# Patient Record
Sex: Female | Born: 1970 | Race: Black or African American | Hispanic: No | Marital: Single | Smoking: Never smoker
Health system: Southern US, Community
[De-identification: ages and names within clinical notes are randomized; demographics above are authoritative.]

## PROBLEM LIST (undated history)

## (undated) ENCOUNTER — Inpatient Hospital Stay (HOSPITAL_COMMUNITY): Payer: Self-pay

## (undated) DIAGNOSIS — E7800 Pure hypercholesterolemia, unspecified: Secondary | ICD-10-CM

## (undated) HISTORY — PX: UNILATERAL SALPINGECTOMY: SHX6160

## (undated) HISTORY — PX: KNEE SURGERY: SHX244

## (undated) HISTORY — PX: BREAST REDUCTION SURGERY: SHX8

## (undated) HISTORY — PX: OTHER SURGICAL HISTORY: SHX169

## (undated) HISTORY — PX: WISDOM TOOTH EXTRACTION: SHX21

---

## 2007-03-28 ENCOUNTER — Emergency Department (HOSPITAL_COMMUNITY): Admission: EM | Admit: 2007-03-28 | Discharge: 2007-03-28 | Payer: Self-pay | Admitting: Emergency Medicine

## 2010-03-31 ENCOUNTER — Encounter (INDEPENDENT_AMBULATORY_CARE_PROVIDER_SITE_OTHER): Payer: Self-pay | Admitting: Obstetrics and Gynecology

## 2010-03-31 ENCOUNTER — Ambulatory Visit (HOSPITAL_COMMUNITY): Admission: RE | Admit: 2010-03-31 | Discharge: 2010-04-02 | Payer: Self-pay | Admitting: Obstetrics and Gynecology

## 2010-08-02 LAB — CBC
HCT: 33.4 % — ABNORMAL LOW (ref 36.0–46.0)
Hemoglobin: 11.2 g/dL — ABNORMAL LOW (ref 12.0–15.0)
MCH: 32.8 pg (ref 26.0–34.0)
MCH: 33.1 pg (ref 26.0–34.0)
MCHC: 33.5 g/dL (ref 30.0–36.0)
MCHC: 33.6 g/dL (ref 30.0–36.0)
Platelets: 265 10*3/uL (ref 150–400)
RBC: 3.32 MIL/uL — ABNORMAL LOW (ref 3.87–5.11)
RDW: 13.7 % (ref 11.5–15.5)

## 2010-08-02 LAB — COMPREHENSIVE METABOLIC PANEL
ALT: 10 U/L (ref 0–35)
CO2: 26 mEq/L (ref 19–32)
Calcium: 8.9 mg/dL (ref 8.4–10.5)
Chloride: 106 mEq/L (ref 96–112)
GFR calc non Af Amer: >60 mL/min (ref >60)
Glucose, Bld: 104 mg/dL — ABNORMAL HIGH (ref 70–99)
Sodium: 136 mEq/L (ref 135–145)
Total Bilirubin: 0.6 mg/dL (ref 0.3–1.2)

## 2010-08-02 LAB — HCG, QUANTITATIVE, PREGNANCY: hCG, Beta Chain, Quant, S: 4857 m[IU]/mL — ABNORMAL HIGH (ref <5)

## 2014-08-17 ENCOUNTER — Other Ambulatory Visit (HOSPITAL_COMMUNITY): Payer: Self-pay | Admitting: Urology

## 2014-08-17 DIAGNOSIS — O09521 Supervision of elderly multigravida, first trimester: Secondary | ICD-10-CM

## 2014-08-17 DIAGNOSIS — Z3682 Encounter for antenatal screening for nuchal translucency: Secondary | ICD-10-CM

## 2014-09-03 ENCOUNTER — Encounter (HOSPITAL_COMMUNITY): Payer: Self-pay | Admitting: *Deleted

## 2014-09-03 ENCOUNTER — Inpatient Hospital Stay (HOSPITAL_COMMUNITY)
Admission: AD | Admit: 2014-09-03 | Discharge: 2014-09-03 | Disposition: A | Discharge status: Home / Self Care | Payer: Medicaid Other | Source: Ambulatory Visit | Attending: Obstetrics & Gynecology | Admitting: Obstetrics & Gynecology

## 2014-09-03 ENCOUNTER — Inpatient Hospital Stay (HOSPITAL_COMMUNITY): Payer: Medicaid Other

## 2014-09-03 ENCOUNTER — Other Ambulatory Visit (HOSPITAL_COMMUNITY): Payer: Self-pay | Admitting: Nurse Practitioner

## 2014-09-03 DIAGNOSIS — O209 Hemorrhage in early pregnancy, unspecified: Secondary | ICD-10-CM | POA: Insufficient documentation

## 2014-09-03 DIAGNOSIS — Z3A11 11 weeks gestation of pregnancy: Secondary | ICD-10-CM | POA: Diagnosis not present

## 2014-09-03 DIAGNOSIS — O4691 Antepartum hemorrhage, unspecified, first trimester: Secondary | ICD-10-CM

## 2014-09-03 DIAGNOSIS — O3680X0 Pregnancy with inconclusive fetal viability, not applicable or unspecified: Secondary | ICD-10-CM

## 2014-09-03 DIAGNOSIS — O09521 Supervision of elderly multigravida, first trimester: Secondary | ICD-10-CM | POA: Diagnosis not present

## 2014-09-03 DIAGNOSIS — Z3A12 12 weeks gestation of pregnancy: Secondary | ICD-10-CM | POA: Diagnosis not present

## 2014-09-03 LAB — CBC
HCT: 34.8 % — ABNORMAL LOW (ref 36.0–46.0)
Hemoglobin: 12.1 g/dL (ref 12.0–15.0)
MCH: 32.2 pg (ref 26.0–34.0)
MCHC: 34.8 g/dL (ref 30.0–36.0)
MCV: 92.6 fL (ref 78.0–100.0)
PLATELETS: 320 10*3/uL (ref 150–400)
RBC: 3.76 MIL/uL — ABNORMAL LOW (ref 3.87–5.11)
RDW: 12.9 % (ref 11.5–15.5)
WBC: 11.6 10*3/uL — ABNORMAL HIGH (ref 4.0–10.5)

## 2014-09-03 LAB — WET PREP, GENITAL
CLUE CELLS WET PREP: NONE SEEN
TRICH WET PREP: NONE SEEN
WBC, Wet Prep HPF POC: NONE SEEN
Yeast Wet Prep HPF POC: NONE SEEN

## 2014-09-03 LAB — HCG, QUANTITATIVE, PREGNANCY: hCG, Beta Chain, Quant, S: 5750 m[IU]/mL — ABNORMAL HIGH (ref <5)

## 2014-09-03 NOTE — Discharge Instructions (Signed)
Miscarriage °A miscarriage is the sudden loss of an unborn baby (fetus) before the 20th week of pregnancy. Most miscarriages happen in the first 3 months of pregnancy. Sometimes, it happens before a woman even knows she is pregnant. A miscarriage is also called a "spontaneous miscarriage" or "early pregnancy loss." Having a miscarriage can be an emotional experience. Talk with your caregiver about any questions you may have about miscarrying, the grieving process, and your future pregnancy plans. °CAUSES  °· Problems with the fetal chromosomes that make it impossible for the baby to develop normally. Problems with the baby's genes or chromosomes are most often the result of errors that occur, by chance, as the embryo divides and grows. The problems are not inherited from the parents. °· Infection of the cervix or uterus.   °· Hormone problems.   °· Problems with the cervix, such as having an incompetent cervix. This is when the tissue in the cervix is not strong enough to hold the pregnancy.   °· Problems with the uterus, such as an abnormally shaped uterus, uterine fibroids, or congenital abnormalities.   °· Certain medical conditions.   °· Smoking, drinking alcohol, or taking illegal drugs.   °· Trauma.   °Often, the cause of a miscarriage is unknown.  °SYMPTOMS  °· Vaginal bleeding or spotting, with or without cramps or pain. °· Pain or cramping in the abdomen or lower back. °· Passing fluid, tissue, or blood clots from the vagina. °DIAGNOSIS  °Your caregiver will perform a physical exam. You may also have an ultrasound to confirm the miscarriage. Blood or urine tests may also be ordered. °TREATMENT  °· Sometimes, treatment is not necessary if you naturally pass all the fetal tissue that was in the uterus. If some of the fetus or placenta remains in the body (incomplete miscarriage), tissue left behind may become infected and must be removed. Usually, a dilation and curettage (D and C) procedure is performed.  During a D and C procedure, the cervix is widened (dilated) and any remaining fetal or placental tissue is gently removed from the uterus. °· Antibiotic medicines are prescribed if there is an infection. Other medicines may be given to reduce the size of the uterus (contract) if there is a lot of bleeding. °· If you have Rh negative blood and your baby was Rh positive, you will need a Rh immunoglobulin shot. This shot will protect any future baby from having Rh blood problems in future pregnancies. °HOME CARE INSTRUCTIONS  °· Your caregiver may order bed rest or may allow you to continue light activity. Resume activity as directed by your caregiver. °· Have someone help with home and family responsibilities during this time.   °· Keep track of the number of sanitary pads you use each day and how soaked (saturated) they are. Write down this information.   °· Do not use tampons. Do not douche or have sexual intercourse until approved by your caregiver.   °· Only take over-the-counter or prescription medicines for pain or discomfort as directed by your caregiver.   °· Do not take aspirin. Aspirin can cause bleeding.   °· Keep all follow-up appointments with your caregiver.   °· If you or your partner have problems with grieving, talk to your caregiver or seek counseling to help cope with the pregnancy loss. Allow enough time to grieve before trying to get pregnant again.   °SEEK IMMEDIATE MEDICAL CARE IF:  °· You have severe cramps or pain in your back or abdomen. °· You have a fever. °· You pass large blood clots (walnut-sized   or larger) ortissue from your vagina. Save any tissue for your caregiver to inspect.   Your bleeding increases.   You have a thick, bad-smelling vaginal discharge.  You become lightheaded, weak, or you faint.   You have chills.  MAKE SURE YOU:  Understand these instructions.  Will watch your condition.  Will get help right away if you are not doing well or get  worse. Document Released: 11/01/2000 Document Revised: 09/02/2012 Document Reviewed: 06/27/2011 [facility] Patient Information 2015 [place], [place]. This information is not intended to replace advice given to you by your health care provider. Make sure you discuss any questions you have with your health care provider.  Ectopic Pregnancy An ectopic pregnancy is when the fertilized egg attaches (implants) outside the uterus. Most ectopic pregnancies occur in the fallopian tube. Rarely do ectopic pregnancies occur on the ovary, intestine, pelvis, or cervix. In an ectopic pregnancy, the fertilized egg does not have the ability to develop into a normal, healthy baby.  A ruptured ectopic pregnancy is one in which the fallopian tube gets torn or bursts and results in internal bleeding. Often there is intense abdominal pain, and sometimes, vaginal bleeding. Having an ectopic pregnancy can be life threatening. If left untreated, this dangerous condition can lead to a blood transfusion, abdominal surgery, or even death. CAUSES  Damage to the fallopian tubes is the suspected cause in most ectopic pregnancies.  RISK FACTORS Depending on your circumstances, the risk of having an ectopic pregnancy will vary. The level of risk can be divided into three categories. High Risk  You have gone through infertility treatment.  You have had a previous ectopic pregnancy.  You have had previous tubal surgery.  You have had previous surgery to have the fallopian tubes tied (tubal ligation).  You have tubal problems or diseases.  You have been exposed to DES. DES is a medicine that was used until 1971 and had effects on babies whose mothers took the medicine.  You become pregnant while using an intrauterine device (IUD) for birth control. Moderate Risk  You have a history of infertility.  You have a history of a sexually transmitted infection (STI).  You have a history of pelvic inflammatory disease  (PID).  You have scarring from endometriosis.  You have multiple sexual partners.  You smoke. Low Risk  You have had previous pelvic surgery.  You use vaginal douching.  You became sexually active before 42 years of age. SIGNS AND SYMPTOMS  An ectopic pregnancy should be suspected in anyone who has missed a period and has abdominal pain or bleeding.  You may experience normal pregnancy symptoms, such as:  Nausea.  Tiredness.  Breast tenderness.  Other symptoms may include:  Pain with intercourse.  Irregular vaginal bleeding or spotting.  Cramping or pain on one side or in the lower abdomen.  Fast heartbeat.  Passing out while having a bowel movement.  Symptoms of a ruptured ectopic pregnancy and internal bleeding may include:  Sudden, severe pain in the abdomen and pelvis.  Dizziness or fainting.  Pain in the shoulder area. DIAGNOSIS  Tests that may be performed include:  A pregnancy test.  An ultrasound test.  Testing the specific level of pregnancy hormone in the bloodstream.  Taking a sample of uterus tissue (dilation and curettage, D&C).  Surgery to perform a visual exam of the inside of the abdomen using a thin, lighted tube with a tiny camera on the end (laparoscope). TREATMENT  An injection of a medicine called  methotrexate may be given. This medicine causes the pregnancy tissue to be absorbed. It is given if:  The diagnosis is made early.  The fallopian tube has not ruptured.  You are considered to be a good candidate for the medicine. Usually, pregnancy hormone blood levels are checked after methotrexate treatment. This is to be sure the medicine is effective. It may take 4-6 weeks for the pregnancy to be absorbed (though most pregnancies will be absorbed by 3 weeks). Surgical treatment may be needed. A laparoscope may be used to remove the pregnancy tissue. If severe internal bleeding occurs, a cut (incision) may be made in the lower abdomen  (laparotomy), and the ectopic pregnancy is removed. This stops the bleeding. Part of the fallopian tube, or the whole tube, may be removed as well (salpingectomy). After surgery, pregnancy hormone tests may be done to be sure there is no pregnancy tissue left. You may receive a Rho (D) immune globulin shot if you are Rh negative and the father is Rh positive, or if you do not know the Rh type of the father. This is to prevent problems with any future pregnancy. SEEK IMMEDIATE MEDICAL CARE IF:  You have any symptoms of an ectopic pregnancy. This is a medical emergency. MAKE SURE YOU:  Understand these instructions.  Will watch your condition.  Will get help right away if you are not doing well or get worse. Document Released: 06/15/2004 Document Revised: 09/22/2013 Document Reviewed: 12/05/2012 [facility] Patient Information 2015 [place], [place]. This information is not intended to replace advice given to you by your health care provider. Make sure you discuss any questions you have with your health care provider.

## 2014-09-03 NOTE — MAU Provider Note (Signed)
History     CSN: [id number]  Arrival date and time: 09/03/14 [id number]   First Provider Initiated Contact with Patient 09/03/14 2053      No chief complaint on file.  HPI Comments: Crystal Cobb is a 44 y.o. [id number] at 1099w6d who presents today with vaginal bleeding. She states that she started bleeding around 1900 today, and about 10 mins ago she passed a large clot and possibly "something else" in the toilet. She was not sure if there was any tissue. She denies any abdominal pain at this time. [facility]   Vaginal Bleeding The patient's primary symptoms include vaginal bleeding. The patient's pertinent negatives include no pelvic pain. This is a new problem. The current episode started today. The problem has been gradually worsening. The patient is experiencing no pain. She is pregnant. Pertinent negatives include no abdominal pain, constipation, diarrhea, dysuria, fever, frequency, nausea, urgency or vomiting. She has been passing clots. She has been passing tissue.    History reviewed. No pertinent past medical history.  Past Surgical History  Procedure Laterality Date  . Knee surgery    . Tummy    . Lipo suction      as well as skin readjusted and tucked  . Breast reduction surgery    . Unilateral salpingectomy      right side  . Wisdom tooth extraction      History reviewed. No pertinent family history.  History  Substance Use Topics  . Smoking status: Never Smoker   . Smokeless tobacco: Not on file  . Alcohol Use: No    Allergies: No Known Allergies  Prescriptions prior to admission  Medication Sig Dispense Refill Last Dose  . Coconut Oil 1000 MG CAPS Take 1 capsule by mouth daily.   Past Week at Unknown time  . Iron-Vitamins (GERITOL PO) Take 1 tablet by mouth daily.   Past Week  . Multiple Minerals-Vitamins (CALCIUM-MAGNESIUM-ZINC-D3 PO) Take 1 tablet by mouth daily.   Past Week at Unknown time  . Multiple Vitamin (MULTIVITAMIN WITH  MINERALS) TABS tablet Take 1 tablet by mouth daily.   Past Week at Unknown time  . Probiotic Product (PROBIOTIC PO) Take 1 capsule by mouth daily.   Past Week at Unknown time  . RASPBERRY PO Take 2 capsules by mouth daily.   Past Week at Unknown time  . UNABLE TO FIND Take 2 capsules by mouth daily. Patient takes the herbal supplment Nettles.   Past Week at Unknown time  . vitamin C (ASCORBIC ACID) 500 MG tablet Take 500 mg by mouth daily.   Past Week at Unknown time    Review of Systems  Constitutional: Negative for fever.  Gastrointestinal: Negative for nausea, vomiting, abdominal pain, diarrhea and constipation.  Genitourinary: Positive for vaginal bleeding. Negative for dysuria, urgency, frequency and pelvic pain.   Physical Exam   Blood pressure 120/71, pulse 73, temperature 98.9 F (37.2 C), temperature source Oral, resp. rate 18, height 5\' 5"  (1.651 m), weight 194 lb 8 oz (88.225 kg).  Physical Exam  Nursing note and vitals reviewed. Constitutional: She is oriented to person, place, and time. She appears well-developed and well-nourished. No distress.  Cardiovascular: Normal rate.   Respiratory: Effort normal.  GI: Soft. There is no tenderness. There is no rebound.  Genitourinary:   External: no lesion, golf ball sized piece of tissue on the pad  Vagina: small amount of blood seen  Cervix: pink, smooth, no CMT  Uterus: slightly enlarged  Adnexa: NT   Neurological: She is alert and oriented to person, place, and time.  Skin: Skin is warm and dry.  Psychiatric: She has a normal mood and affect.   Results for orders placed or performed during the hospital encounter of 09/03/14 (from the past 24 hour(s))  CBC     Status: Abnormal   Collection Time: 09/03/14  8:39 PM  Result Value Ref Range   WBC 11.6 (H) 4.0 - 10.5 K/uL   RBC 3.76 (L) 3.87 - 5.11 MIL/uL   Hemoglobin 12.1 12.0 - 15.0 g/dL   HCT 57.8 (L) 46.9 - 62.9 %   MCV 92.6 78.0 - 100.0 fL   MCH 32.2 26.0 - 34.0 pg    MCHC 34.8 30.0 - 36.0 g/dL   RDW 52.8 41.3 - 24.4 %   Platelets 320 150 - 400 K/uL  hCG, quantitative, pregnancy     Status: Abnormal   Collection Time: 09/03/14  8:39 PM  Result Value Ref Range   hCG, Beta Chain, Quant, S 5750 (H) <5 mIU/mL  Wet prep, genital     Status: None   Collection Time: 09/03/14  9:00 PM  Result Value Ref Range   Yeast Wet Prep HPF POC NONE SEEN NONE SEEN   Trich, Wet Prep NONE SEEN NONE SEEN   Clue Cells Wet Prep HPF POC NONE SEEN NONE SEEN   WBC, Wet Prep HPF POC NONE SEEN NONE SEEN   US Ob Comp Less 14 Wks  09/03/2014   CLINICAL DATA:  Bleeding and cramps in first-trimester pregnancy. Possibly passed tissue.  EXAM: OBSTETRIC <14 WK [place] AND TRANSVAGINAL OB US  TECHNIQUE: Both transabdominal and transvaginal ultrasound examinations were performed for complete evaluation of the gestation as well as the maternal uterus, adnexal regions, and pelvic cul-de-sac. Transvaginal technique was performed to assess early pregnancy.  COMPARISON:  03/31/2010  FINDINGS: No intra or extrauterine gestational sac is identified. No adnexal mass and ovarian volume is symmetric. No free pelvic fluid. Bilateral ovarian follicles are noted.  These results were called by telephone at the time of interpretation on 09/03/2014 at 10:50 pm to Dr. Thressa Sheller , who verbally acknowledged these results.  IMPRESSION: Normal pelvic ultrasound with no visible gestation. Given patient's quantitative beta HCG this increases is concern for spontaneous abortion or ectopic pregnancy.   Electronically Signed   By: Marnee Spring M.D.   On: 09/03/2014 22:50   US Ob Transvaginal  09/03/2014   CLINICAL DATA:  Bleeding and cramps in first-trimester pregnancy. Possibly passed tissue.  EXAM: OBSTETRIC <14 WK [place] AND TRANSVAGINAL OB US  TECHNIQUE: Both transabdominal and transvaginal ultrasound examinations were performed for complete evaluation of the gestation as well as the maternal uterus, adnexal regions, and  pelvic cul-de-sac. Transvaginal technique was performed to assess early pregnancy.  COMPARISON:  03/31/2010  FINDINGS: No intra or extrauterine gestational sac is identified. No adnexal mass and ovarian volume is symmetric. No free pelvic fluid. Bilateral ovarian follicles are noted.  These results were called by telephone at the time of interpretation on 09/03/2014 at 10:50 pm to Dr. Thressa Sheller , who verbally acknowledged these results.  IMPRESSION: Normal pelvic ultrasound with no visible gestation. Given patient's quantitative beta HCG this increases is concern for spontaneous abortion or ectopic pregnancy.   Electronically Signed   By: Marnee Spring M.D.   On: 09/03/2014 22:50     MAU Course  Procedures  MDM   Assessment and Plan  1. Pregnancy of unknown anatomic location   2. Vaginal bleeding in pregnancy, first trimester    D/W the patient that this is most likely a miscarriage.  Return in 48 hours for repeat HCG Bleeding precautions Ectopic precautions  Return to MAU as needed  Follow-up Information    Follow up with [facility].   Why:  in 48 hours for repeat blood work    Contact information:   [address] [id number] mc [place] [address] [phone]      Tawnya Crook 09/03/2014, 8:54 PM

## 2014-09-03 NOTE — MAU Note (Signed)
PT  SAYS SHE STARTED   SPOTTING  BROWN  YESTERDAY  AND  THIS AM -  SO WENT  TO  HD-  LEFT  THERE AT 3 PM-  ALL TESTING  GOOD-  COULD  NOT  HEAR  FHT.    [facility]   THEN  AT  715PM-  FELT  BLEEDING-  PAD ON IN TRIAGE-  LARGE AMT- PAD SATURATED  AND ON CLOTHES.     TO RM  9-  STARTED FEELING  CRAMPING- LIGHT  - SAYS  MORE NOW BUT  NOT  SEVERE.    LAST SEX-   FEB

## 2014-09-04 LAB — GC/CHLAMYDIA PROBE AMP (~~LOC~~) NOT AT ARMC
Chlamydia: NEGATIVE
Neisseria Gonorrhea: NEGATIVE

## 2014-09-04 LAB — HIV ANTIBODY (ROUTINE TESTING W REFLEX): HIV SCREEN 4TH GENERATION: NONREACTIVE

## 2014-09-05 ENCOUNTER — Inpatient Hospital Stay (HOSPITAL_COMMUNITY)
Admission: AD | Admit: 2014-09-05 | Discharge: 2014-09-05 | Disposition: A | Discharge status: Home / Self Care | Payer: Medicaid Other | Source: Ambulatory Visit | Attending: Obstetrics & Gynecology | Admitting: Obstetrics & Gynecology

## 2014-09-05 DIAGNOSIS — O036 Delayed or excessive hemorrhage following complete or unspecified spontaneous abortion: Secondary | ICD-10-CM | POA: Insufficient documentation

## 2014-09-05 DIAGNOSIS — O039 Complete or unspecified spontaneous abortion without complication: Secondary | ICD-10-CM | POA: Diagnosis not present

## 2014-09-05 LAB — HCG, QUANTITATIVE, PREGNANCY: HCG, BETA CHAIN, QUANT, S: 1076 m[IU]/mL — AB (ref <5)

## 2014-09-05 NOTE — Discharge Instructions (Signed)
Miscarriage °A miscarriage is the loss of an unborn baby (fetus) before the 20th week of pregnancy. The cause is often unknown.  °HOME CARE °· You may need to stay in bed (bed rest), or you may be able to do light activity. Go about activity as told by your doctor. °· Have help at home. °· Write down how many pads you use each day. Write down how soaked they are. °· Do not use tampons. Do not wash out your vagina (douche) or have sex (intercourse) until your doctor approves. °· Only take medicine as told by your doctor. °· Do not take aspirin. °· Keep all doctor visits as told. °· If you or your partner have problems with grieving, talk to your doctor. You can also try counseling. Give yourself time to grieve before trying to get pregnant again. °GET HELP RIGHT AWAY IF: °· You have bad cramps or pain in your back or belly (abdomen). °· You have a fever. °· You pass large clumps of blood (clots) from your vagina that are walnut-sized or larger. Save the clumps for your doctor to see. °· You pass large amounts of tissue from your vagina. Save the tissue for your doctor to see. °· You have more bleeding. °· You have thick, bad-smelling fluid (discharge) coming from the vagina. °· You get lightheaded, weak, or you pass out (faint). °· You have chills. °MAKE SURE YOU: °· Understand these instructions. °· Will watch your condition. °· Will get help right away if you are not doing well or get worse. °Document Released: 07/31/2011 Document Reviewed: 07/31/2011 °ExitCare® Patient Information ©2015 Exit[employer], LLC. This information is not intended to replace advice given to you by your health care provider. Make sure you discuss any questions you have with your health care provider. ° °

## 2014-09-05 NOTE — MAU Note (Signed)
Crystal MaclachlanKaren Teague-Clark PA in Triage to talk with pt regarding lab results and d/c plan. Pt d/c home from Triage

## 2014-09-05 NOTE — MAU Provider Note (Signed)
Crystal Cobb 44 y.o. G5 presents to MAU for Quant HCG after having a miscarriage on 4/14.  She has continued to have light vaginal bleeding and no abdominal pain or other complaint.    HCG has dropped from 5750 to 1076 today Vitals stable  [facility]    Ectopic precautions Patient may return to MAU as needed or if her condition were to change or worsen

## 2014-09-05 NOTE — MAU Note (Signed)
Here for repeat BHCG. Denies any problems. No bleeding or pain

## 2014-09-07 ENCOUNTER — Ambulatory Visit (HOSPITAL_COMMUNITY): Admission: RE | Admit: 2014-09-07 | Payer: Medicaid Other | Source: Ambulatory Visit

## 2014-09-07 ENCOUNTER — Telehealth: Payer: Self-pay

## 2014-09-07 ENCOUNTER — Other Ambulatory Visit: Payer: Medicaid Other

## 2014-09-07 NOTE — Telephone Encounter (Signed)
Attempted to contact patient. No answer. Left message stating a lab appointment is not necessary today, however, we would like to schedule it for next Monday 09/14/14, please call clinic so that we can coordinate a time that works best with your schedule.

## 2014-09-07 NOTE — Telephone Encounter (Signed)
Patient called and scheduled lab for 09/14/14 at 1045.

## 2014-09-07 NOTE — Telephone Encounter (Signed)
-----   Message from Lesly DukesKelly H Leggett, MD sent at 09/06/2014  6:47 AM EDT ----- Patient doesn't need beta hcg until NEXT monday.  Unsure from note when she was told to come.  Had very large drop in hcg so clinical picture c/w miscarriage.

## 2014-09-10 ENCOUNTER — Other Ambulatory Visit (HOSPITAL_COMMUNITY): Payer: Self-pay

## 2014-09-10 ENCOUNTER — Ambulatory Visit (HOSPITAL_COMMUNITY): Payer: Self-pay | Attending: Physician Assistant

## 2014-09-10 ENCOUNTER — Ambulatory Visit (HOSPITAL_COMMUNITY): Admission: RE | Admit: 2014-09-10 | Payer: Self-pay | Source: Ambulatory Visit

## 2014-09-14 ENCOUNTER — Other Ambulatory Visit: Payer: Medicaid Other

## 2014-09-14 DIAGNOSIS — O209 Hemorrhage in early pregnancy, unspecified: Secondary | ICD-10-CM

## 2014-09-15 LAB — HCG, QUANTITATIVE, PREGNANCY: hCG, Beta Chain, Quant, S: 62.8 m[IU]/mL

## 2014-09-17 ENCOUNTER — Telehealth: Payer: Self-pay | Admitting: General Practice

## 2014-09-17 NOTE — Telephone Encounter (Signed)
-----   Message from Tereso NewcomerUgonna A Anyanwu, MD sent at 09/16/2014  5:23 PM EDT ----- Return next week for repeat HCG.  Please call to inform patient of results and recommendations.

## 2014-09-17 NOTE — Telephone Encounter (Signed)
Called patient and informed her of results and recommendations. Patient verbalized understanding and states she can come in next week 5/4 @ 10am. Patient had no questions

## 2014-09-23 ENCOUNTER — Other Ambulatory Visit: Payer: Medicaid Other

## 2014-09-23 DIAGNOSIS — O039 Complete or unspecified spontaneous abortion without complication: Secondary | ICD-10-CM

## 2014-09-24 ENCOUNTER — Telehealth: Payer: Self-pay

## 2014-09-24 LAB — HCG, QUANTITATIVE, PREGNANCY: hCG, Beta Chain, Quant, S: 4.3 m[IU]/mL

## 2014-09-24 NOTE — Telephone Encounter (Signed)
-----   Message from Tereso NewcomerUgonna A Anyanwu, MD sent at 09/24/2014  8:35 AM EDT ----- No longer pregnant, no further lab draws needed. Follow up with GYN provider/Health Department for contraception counseling and routine GYN care.  Please call to inform patient of results and recommendations.

## 2014-09-24 NOTE — Telephone Encounter (Signed)
Called patient and informed her of results. Patient does not wish to have birth control as she is trying to get pregnant. Advised patient wait for two cycles before she starts trying to conceive again.  Informed her should she have any problems or need routine GYN care she may call to schedule appointment here or with health department. Patient verbalized understanding and gratitude. No further questions or concerns.

## 2014-10-23 ENCOUNTER — Ambulatory Visit (HOSPITAL_COMMUNITY): Payer: Self-pay

## 2015-07-09 ENCOUNTER — Encounter (HOSPITAL_COMMUNITY): Payer: Self-pay | Admitting: *Deleted

## 2015-08-09 ENCOUNTER — Encounter (HOSPITAL_COMMUNITY): Payer: Self-pay | Admitting: *Deleted

## 2015-08-09 ENCOUNTER — Ambulatory Visit (HOSPITAL_COMMUNITY)
Admission: AD | Admit: 2015-08-09 | Discharge: 2015-08-10 | Disposition: A | Discharge status: Home / Self Care | Payer: Medicaid Other | Source: Ambulatory Visit | Attending: Obstetrics and Gynecology | Admitting: Obstetrics and Gynecology

## 2015-08-09 ENCOUNTER — Inpatient Hospital Stay (HOSPITAL_COMMUNITY): Payer: Medicaid Other

## 2015-08-09 DIAGNOSIS — IMO0002 Reserved for concepts with insufficient information to code with codable children: Secondary | ICD-10-CM

## 2015-08-09 DIAGNOSIS — N939 Abnormal uterine and vaginal bleeding, unspecified: Secondary | ICD-10-CM

## 2015-08-09 DIAGNOSIS — O021 Missed abortion: Secondary | ICD-10-CM | POA: Insufficient documentation

## 2015-08-09 DIAGNOSIS — Z3A10 10 weeks gestation of pregnancy: Secondary | ICD-10-CM | POA: Diagnosis not present

## 2015-08-09 DIAGNOSIS — O209 Hemorrhage in early pregnancy, unspecified: Secondary | ICD-10-CM | POA: Diagnosis present

## 2015-08-09 DIAGNOSIS — D649 Anemia, unspecified: Secondary | ICD-10-CM | POA: Diagnosis not present

## 2015-08-09 LAB — URINALYSIS, ROUTINE W REFLEX MICROSCOPIC
BILIRUBIN URINE: NEGATIVE
GLUCOSE, UA: NEGATIVE mg/dL
KETONES UR: 15 mg/dL — AB
Leukocytes, UA: NEGATIVE
Nitrite: NEGATIVE
PROTEIN: NEGATIVE mg/dL
Specific Gravity, Urine: >1.03 — ABNORMAL HIGH (ref 1.005–1.030)
pH: 5.5 (ref 5.0–8.0)

## 2015-08-09 LAB — WET PREP, GENITAL
SPERM: NONE SEEN
TRICH WET PREP: NONE SEEN
Yeast Wet Prep HPF POC: NONE SEEN

## 2015-08-09 LAB — CBC
HEMATOCRIT: 34.2 % — AB (ref 36.0–46.0)
Hemoglobin: 11.9 g/dL — ABNORMAL LOW (ref 12.0–15.0)
MCH: 32.9 pg (ref 26.0–34.0)
MCHC: 34.8 g/dL (ref 30.0–36.0)
MCV: 94.5 fL (ref 78.0–100.0)
Platelets: 313 10*3/uL (ref 150–400)
RBC: 3.62 MIL/uL — AB (ref 3.87–5.11)
RDW: 13.5 % (ref 11.5–15.5)
WBC: 11.6 10*3/uL — ABNORMAL HIGH (ref 4.0–10.5)

## 2015-08-09 LAB — URINE MICROSCOPIC-ADD ON

## 2015-08-09 NOTE — MAU Provider Note (Signed)
History     Crystal Cobb, Crystal Cobb is a 45yo, [id number], at 10.[redacted] wks ega presenting unannounced to MAU for vaginal bleeding.    She reports brownish discharge that started in the AM and has progressed to vaginal bleeding requiring the use of a sanitary pad.   Reports some mild cramping.   There are no active problems to display for this patient.   No chief complaint on file.  HPI  OB History    Gravida Para Term Preterm AB TAB SAB Ectopic Multiple Living   6 2   1   1  2       History reviewed. No pertinent past medical history.  Past Surgical History  Procedure Laterality Date  . Knee surgery    . Tummy      tummy tuck  . Lipo suction      as well as skin readjusted and tucked  . Breast reduction surgery    . Unilateral salpingectomy      right side  . Wisdom tooth extraction      No family history on file.  Social History  Substance Use Topics  . Smoking status: Never Smoker   . Smokeless tobacco: None  . Alcohol Use: No    Allergies: No Known Allergies  Prescriptions prior to admission  Medication Sig Dispense Refill Last Dose  . Multiple Vitamin (MULTIVITAMIN WITH MINERALS) TABS tablet Take 1 tablet by mouth daily.   08/08/2015 at Unknown time  . Omega-3 Fatty Acids (FISH OIL PO) Take 1 capsule by mouth every other day.   08/08/2015 at Unknown time    ROS Physical Exam   Blood pressure 116/67, pulse 63, temperature 99 F (37.2 C), temperature source Oral, resp. rate 16, height 5\' 6"  (1.676 m), weight 87.998 kg (194 lb), SpO2 100 %, unknown if currently breastfeeding.  Filed Vitals:   08/09/15 2103  BP: 116/67  Pulse: 63  Temp: 99 F (37.2 C)  TempSrc: Oral  Resp: 16  Height: 5\' 6"  (1.676 m)  Weight: 87.998 kg (194 lb)  SpO2: 100%    Results for orders placed or performed during the hospital encounter of 08/09/15 (from the past 24 hour(s))  Urinalysis, Routine w reflex microscopic (not at [facility])     Status: Abnormal   Collection Time: 08/09/15  9:02 PM   Result Value Ref Range   Color, Urine YELLOW YELLOW   APPearance HAZY (A) CLEAR   Specific Gravity, Urine >1.030 (H) 1.005 - 1.030   pH 5.5 5.0 - 8.0   Glucose, UA NEGATIVE NEGATIVE mg/dL   Hgb urine dipstick LARGE (A) NEGATIVE   Bilirubin Urine NEGATIVE NEGATIVE   Ketones, ur 15 (A) NEGATIVE mg/dL   Protein, ur NEGATIVE NEGATIVE mg/dL   Nitrite NEGATIVE NEGATIVE   Leukocytes, UA NEGATIVE NEGATIVE  Urine microscopic-add on     Status: Abnormal   Collection Time: 08/09/15  9:02 PM  Result Value Ref Range   Squamous Epithelial / LPF 6-30 (A) NONE SEEN   WBC, UA 0-5 0 - 5 WBC/hpf   RBC / HPF 6-30 0 - 5 RBC/hpf   Bacteria, UA MANY (A) NONE SEEN   Urine-Other MUCOUS PRESENT   CBC     Status: Abnormal   Collection Time: 08/09/15 11:10 PM  Result Value Ref Range   WBC 11.6 (H) 4.0 - 10.5 K/uL   RBC 3.62 (L) 3.87 - 5.11 MIL/uL   Hemoglobin 11.9 (L) 12.0 - 15.0 g/dL   HCT 16.134.2 (L) 09.636.0 - 04.546.0 %  MCV 94.5 78.0 - 100.0 fL   MCH 32.9 26.0 - 34.0 pg   MCHC 34.8 30.0 - 36.0 g/dL   RDW 14.7 82.9 - 56.2 %   Platelets 313 150 - 400 K/uL     Physical Exam  Constitutional: She is oriented to person, place, and time. She appears well-developed and well-nourished.  HENT:  Head: Normocephalic and atraumatic.  Eyes: Pupils are equal, round, and reactive to light.  Neck: Normal range of motion.  Cardiovascular: Normal rate.   Respiratory: Effort normal.  GI: Soft.  Genitourinary: Cervix exhibits no friability. There is bleeding in the vagina.  Musculoskeletal: Normal range of motion.  Neurological: She is alert and oriented to person, place, and time. She has normal reflexes.  Skin: Skin is warm and dry.  Psychiatric: She has a normal mood and affect. Her behavior is normal. Judgment and thought content normal.    ED Course  Assessment: First trimester vaginal bleeding  Plan: Wet prep GC./CT culture CBC HCG [place]    Alphonzo Severance CNM, MSN 08/09/2015 10:32 PM

## 2015-08-09 NOTE — MAU Note (Signed)
Pt reports brownish discharge that started this am and is now having bleeding, states she has just had to put on a pad.

## 2015-08-10 ENCOUNTER — Encounter (HOSPITAL_COMMUNITY)
Admission: AD | Disposition: A | Discharge status: Home / Self Care | Payer: Self-pay | Source: Ambulatory Visit | Attending: Obstetrics and Gynecology

## 2015-08-10 ENCOUNTER — Inpatient Hospital Stay (HOSPITAL_COMMUNITY): Payer: Medicaid Other | Admitting: Anesthesiology

## 2015-08-10 ENCOUNTER — Encounter (HOSPITAL_COMMUNITY): Payer: Self-pay | Admitting: Anesthesiology

## 2015-08-10 DIAGNOSIS — Z3A10 10 weeks gestation of pregnancy: Secondary | ICD-10-CM | POA: Diagnosis not present

## 2015-08-10 DIAGNOSIS — D649 Anemia, unspecified: Secondary | ICD-10-CM | POA: Diagnosis not present

## 2015-08-10 DIAGNOSIS — IMO0002 Reserved for concepts with insufficient information to code with codable children: Secondary | ICD-10-CM

## 2015-08-10 DIAGNOSIS — O021 Missed abortion: Secondary | ICD-10-CM | POA: Diagnosis not present

## 2015-08-10 HISTORY — PX: DILATION AND EVACUATION: SHX1459

## 2015-08-10 LAB — HCG, QUANTITATIVE, PREGNANCY: HCG, BETA CHAIN, QUANT, S: 11975 m[IU]/mL — AB (ref <5)

## 2015-08-10 LAB — TYPE AND SCREEN
ABO/RH(D): O POS
ANTIBODY SCREEN: NEGATIVE

## 2015-08-10 LAB — MRSA PCR SCREENING: MRSA by PCR: NEGATIVE

## 2015-08-10 SURGERY — DILATION AND EVACUATION, UTERUS
Anesthesia: Monitor Anesthesia Care | Site: Vagina

## 2015-08-10 MED ORDER — ONDANSETRON HCL 4 MG/2ML IJ SOLN
INTRAMUSCULAR | Status: DC | PRN
  Administered 2015-08-10: 4 mg via INTRAVENOUS

## 2015-08-10 MED ORDER — LIDOCAINE HCL (CARDIAC) 20 MG/ML IV SOLN
INTRAVENOUS | Status: DC | PRN
  Administered 2015-08-10: 50 mg via INTRAVENOUS

## 2015-08-10 MED ORDER — PROPOFOL 500 MG/50ML IV EMUL
INTRAVENOUS | Status: DC | PRN
  Administered 2015-08-10 (×3): 50 mg via INTRAVENOUS

## 2015-08-10 MED ORDER — HYDROCODONE-ACETAMINOPHEN 7.5-325 MG PO TABS: 1.0000 | ORAL_TABLET | Freq: Once | ORAL | Status: DC | PRN

## 2015-08-10 MED ORDER — BUPIVACAINE-EPINEPHRINE (PF) 0.5% -1:200000 IJ SOLN
INTRAMUSCULAR | Status: AC
  Filled 2015-08-10: qty 30

## 2015-08-10 MED ORDER — IBUPROFEN 800 MG PO TABS: 800.0000 mg | ORAL_TABLET | Freq: Three times a day (TID) | ORAL | Status: AC | PRN

## 2015-08-10 MED ORDER — MIDAZOLAM HCL 2 MG/2ML IJ SOLN
INTRAMUSCULAR | Status: DC | PRN
  Administered 2015-08-10: 2 mg via INTRAVENOUS

## 2015-08-10 MED ORDER — KETOROLAC TROMETHAMINE 30 MG/ML IJ SOLN: 30.0000 mg | Freq: Once | INTRAMUSCULAR | Status: DC

## 2015-08-10 MED ORDER — FENTANYL CITRATE (PF) 100 MCG/2ML IJ SOLN
INTRAMUSCULAR | Status: AC
  Filled 2015-08-10: qty 2

## 2015-08-10 MED ORDER — LACTATED RINGERS IV SOLN
INTRAVENOUS | Status: DC | PRN
  Administered 2015-08-10: 12:00:00 via INTRAVENOUS

## 2015-08-10 MED ORDER — FENTANYL CITRATE (PF) 100 MCG/2ML IJ SOLN
INTRAMUSCULAR | Status: DC | PRN
  Administered 2015-08-10 (×2): 50 ug via INTRAVENOUS

## 2015-08-10 MED ORDER — KETOROLAC TROMETHAMINE 30 MG/ML IJ SOLN
INTRAMUSCULAR | Status: DC | PRN
  Administered 2015-08-10: 30 mg via INTRAVENOUS

## 2015-08-10 MED ORDER — BUPIVACAINE-EPINEPHRINE 0.5% -1:200000 IJ SOLN
INTRAMUSCULAR | Status: DC | PRN
  Administered 2015-08-10: 10 mL

## 2015-08-10 MED ORDER — PROMETHAZINE HCL 25 MG/ML IJ SOLN: 6.2500 mg | INTRAMUSCULAR | Status: DC | PRN

## 2015-08-10 MED ORDER — MIDAZOLAM HCL 2 MG/2ML IJ SOLN
INTRAMUSCULAR | Status: AC
  Filled 2015-08-10: qty 2

## 2015-08-10 MED ORDER — FENTANYL CITRATE (PF) 100 MCG/2ML IJ SOLN: 25.0000 ug | INTRAMUSCULAR | Status: DC | PRN

## 2015-08-10 SURGICAL SUPPLY — 19 items
CATH ROBINSON RED A/P 16FR (CATHETERS) ×4 IMPLANT
CLOTH BEACON ORANGE TIMEOUT ST (SAFETY) ×4 IMPLANT
CONTAINER PREFILL 10% NBF 60ML (FORM) IMPLANT
DECANTER SPIKE VIAL GLASS SM (MISCELLANEOUS) ×4 IMPLANT
GLOVE BIOGEL PI IND STRL 7.0 (GLOVE) ×2 IMPLANT
GLOVE BIOGEL PI IND STRL 8.5 (GLOVE) ×2 IMPLANT
GLOVE BIOGEL PI INDICATOR 7.0 (GLOVE) ×2
GLOVE BIOGEL PI INDICATOR 8.5 (GLOVE) ×2
GLOVE ECLIPSE 8.0 STRL XLNG CF (GLOVE) ×8 IMPLANT
GOWN STRL REUS W/TWL LRG LVL3 (GOWN DISPOSABLE) ×8 IMPLANT
KIT BERKELEY 1ST TRIMESTER 3/8 (MISCELLANEOUS) ×4 IMPLANT
PACK VAGINAL MINOR WOMEN LF (CUSTOM PROCEDURE TRAY) ×4 IMPLANT
PAD OB MATERNITY 4.3X12.25 (PERSONAL CARE ITEMS) ×4 IMPLANT
PAD PREP 24X48 CUFFED NSTRL (MISCELLANEOUS) ×4 IMPLANT
SET BERKELEY SUCTION TUBING (SUCTIONS) ×4 IMPLANT
SLEEVE SCD COMPRESS KNEE MED (MISCELLANEOUS) ×4 IMPLANT
TOWEL OR 17X24 6PK STRL BLUE (TOWEL DISPOSABLE) ×8 IMPLANT
VACURETTE 8 RIGID CVD (CANNULA) ×4 IMPLANT
WATER STERILE IRR 1000ML POUR (IV SOLUTION) ×4 IMPLANT

## 2015-08-10 NOTE — Op Note (Signed)
OPERATIVE NOTE  Crystal Cobb  DOB:    December 26, 1970  MRN:    [id number]  CSN:    [id number]  Date of Surgery:  08/10/2015  Preoperative Diagnosis:  Missed Abortion at 4332w3d Gestation Anemia  Postoperative Diagnosis:  Missed Abortion at 5732w3d Gestation Anemia  Procedure:  FIRST TRIMESTER SUCTION DILATATION AND CURETTAGE  Surgeon:  Leonard SchwartzArthur Vernon Graig Hessling, M.D.  Assistant:  None  Anesthetic:  Monitor Anesthesia Care  Disposition:  The patient is a 45 y.o. year old female who presents at 4732w3d gestation with a nonviable gestation based on ultrasound and/or quantitative hCG values. She understands the indications for her surgical procedure as well as the alternative treatment options. She accepts the risk of, but not limited to, anesthetic complications, bleeding, infections, and possible damage to the surrounding organs.  Findings:  The patient's blood type is O+. A moderate amount of products of conception were removed from within a 8-10 weeks size uterus. No adnexal masses were appreciated.  Procedure:  The patient was taken to the operating room where a Monitor Anesthesia Care anesthetic was given. The patient's abdomen, perineum, and vagina were prepped with Betadine. The bladder was drained of urine. The patient was sterilely draped. Examination under anesthesia was performed. A paracervical block was placed using 10 cc of half percent Marcaine with epinephrine. The uterus sounded to 10 centimeters. The cervix was gently dilated. The uterine cavity was evacuated using a size 8 suction curet. The cavity was then cleaned using a medium sharp curet. The cavity was felt to be clean at the end of our procedure. The estimated blood loss was 50 cc. The uterus was reexamined and was noted to be firm. Hemostasis was adequate. Sponge and needle counts were correct. The patient tolerated her but her procedure well. She was awakened from her anesthetic without difficulty. She was  transported to the recovery room in stable condition. The products of conception were sent to pathology.  Followup instructions:  The patient return to see Dr. Stefano GaulStringer in 2 weeks. She was given prescriptions for pain medications. She was given instructions for patients who've undergone the surgical procedure. She will call for questions or concerns.  Discharge Medications:  Motrin 800 mg every 8 hours as needed for pain.  Leonard SchwartzArthur Vernon Deiontae Rabel, M.D.

## 2015-08-10 NOTE — Discharge Instructions (Signed)
Dilation and Curettage or Vacuum Curettage  Dilation and curettage (D&C) and vacuum curettage are minor procedures. A D&C involves stretching (dilation) the cervix and scraping (curettage) the inside lining of the womb (uterus). During a D&C, tissue is gently scraped from the inside lining of the uterus. During a vacuum curettage, the lining and tissue in the uterus are removed with the use of gentle suction.  Curettage may be performed to either diagnose or treat a problem. As a diagnostic procedure, curettage is performed to examine tissues from the uterus. A diagnostic curettage may be performed for the following symptoms:   Irregular bleeding in the uterus.   Bleeding with the development of clots.   Spotting between menstrual periods.   Prolonged menstrual periods.   Bleeding after menopause.   No menstrual period (amenorrhea).   A change in size and shape of the uterus.  As a treatment procedure, curettage may be performed for the following reasons:   Removal of an IUD (intrauterine device).   Removal of retained placenta after giving birth. Retained placenta can cause an infection or bleeding severe enough to require transfusions.   Abortion.   Miscarriage.   Removal of polyps inside the uterus.   Removal of uncommon types of noncancerous lumps (fibroids).  LET [facility] CARE PROVIDER KNOW ABOUT:   Any allergies you have.   All medicines you are taking, including vitamins, herbs, eye drops, creams, and over-the-counter medicines.   Previous problems you or members of your family have had with the use of anesthetics.   Any blood disorders you have.   Previous surgeries you have had.   Medical conditions you have. RISKS AND COMPLICATIONS  Generally, this is a safe procedure. However, as with any procedure, complications can occur. Possible complications include:  Excessive bleeding.   Infection of the uterus.   Damage to the cervix.    Development of scar tissue (adhesions) inside the uterus, later causing abnormal amounts of menstrual bleeding.   Complications from the general anesthetic, if a general anesthetic is used.   Putting a hole (perforation) in the uterus. This is rare.  BEFORE THE PROCEDURE   Eat and drink before the procedure only as directed by your health care provider.   Arrange for someone to take you home.  PROCEDURE  This procedure usually takes about 15-30 minutes.  You will be given one of the following:  A medicine that numbs the area in and around the cervix (local anesthetic).   A medicine to make you sleep through the procedure (general anesthetic).  You will lie on your back with your legs in stirrups.   A warm metal or plastic instrument (speculum) will be placed in your vagina to keep it open and to allow the health care provider to see the cervix.  There are two ways in which your cervix can be softened and dilated. These include:   Taking a medicine.   Having thin rods (laminaria) inserted into your cervix.   A curved tool (curette) will be used to scrape cells from the inside lining of the uterus. In some cases, gentle suction is applied with the curette. The curette will then be removed.  AFTER THE PROCEDURE   You will rest in the recovery area until you are stable and are ready to go home.   You may feel sick to your stomach (nauseous) or throw up (vomit) if you were given a general anesthetic.   You may have a sore throat if a  tube was placed in your throat during general anesthesia.   You may have light cramping and bleeding. This may last for 2 days to 2 weeks after the procedure.   Your uterus needs to make a new lining after the procedure. This may make your next period late.   This information is not intended to replace advice given to you by your health care provider. Make sure you discuss any questions you have with your health care provider.    Document Released: 05/08/2005 Document Revised: 01/08/2013 Document Reviewed: 12/05/2012 Elsevier Interactive Patient Education 2016 [employer]    Post Anesthesia Home Care Instructions  Activity: Get plenty of rest for the remainder of the day. A responsible adult should stay with you for 24 hours following the procedure.  For the next 24 hours, DO NOT: -Drive a car -Advertising copywriterperate machinery -Drink alcoholic beverages -Take any medication unless instructed by your physician -Make any legal decisions or sign important papers.  Meals: Start with liquid foods such as gelatin or soup. Progress to regular foods as tolerated. Avoid greasy, spicy, heavy foods. If nausea and/or vomiting occur, drink only clear liquids until the nausea and/or vomiting subsides. Call your physician if vomiting continues.  Special Instructions/Symptoms: Your throat may feel dry or sore from the anesthesia or the breathing tube placed in your throat during surgery. If this causes discomfort, gargle with warm salt water. The discomfort should disappear within 24 hours.  If you had a scopolamine patch placed behind your ear for the management of post- operative nausea and/or vomiting:  1. The medication in the patch is effective for 72 hours, after which it should be removed.  Wrap patch in a tissue and discard in the trash. Wash hands thoroughly with soap and water. 2. You may remove the patch earlier than 72 hours if you experience unpleasant side effects which may include dry mouth, dizziness or visual disturbances. 3. Avoid touching the patch. Wash your hands with soap and water after contact with the patch.   DISCHARGE INSTRUCTIONS: D&C / D&E The following instructions have been prepared to help you care for yourself upon your return home.   Personal hygiene:  Use sanitary pads for vaginal drainage, not tampons.  Shower the day after your procedure.  NO tub baths, pools or Jacuzzis for 2-3 weeks.  Wipe  front to back after using the bathroom.  Activity and limitations:  Do NOT drive or operate any equipment for 24 hours. The effects of anesthesia are still present and drowsiness may result.  Do NOT rest in bed all day.  Walking is encouraged.  Walk up and down stairs slowly.  You may resume your normal activity in one to two days or as indicated by your physician.  Sexual activity: NO intercourse for at least 2 weeks after the procedure, or as indicated by your physician.  Diet: Eat a light meal as desired this evening. You may resume your usual diet tomorrow.  Return to work: You may resume your work activities in one to two days or as indicated by your doctor.  What to expect after your surgery: Expect to have vaginal bleeding/discharge for 2-3 days and spotting for up to 10 days. It is not unusual to have soreness for up to 1-2 weeks. You may have a slight burning sensation when you urinate for the first day. Mild cramps may continue for a couple of days. You may have a regular period in 2-6 weeks.  Call your doctor for any  of the following:  Excessive vaginal bleeding, saturating and changing one pad every hour.  Inability to urinate 6 hours after discharge from hospital.  Pain not relieved by pain medication.  Fever of 100.4 F or greater.  Unusual vaginal discharge or odor.   Call for an appointment:    Patients signature: ______________________  Nurses signature ________________________  Support person's signature_______________________

## 2015-08-10 NOTE — H&P (Signed)
Crystal Cobb is an 45 y.o. female, presenting with c/o brownish discharge that started in am on 08/09/15 that progressively increased throughout the day resulting in use of a sanitary pad. Reports occasional mild cramping.   Patient Active Problem List   Diagnosis Date Noted  . Fetal demise 08/10/2015    Bleeding: vaginal bleeding  OB History: G5, [id number]  MEDICAL/FAMILY/SOCIAL HX: No LMP recorded. Patient is pregnant.    History reviewed. No pertinent past medical history.  Past Surgical History  Procedure Laterality Date  . Knee surgery    . Tummy      tummy tuck  . Lipo suction      as well as skin readjusted and tucked  . Breast reduction surgery    . Unilateral salpingectomy      right side  . Wisdom tooth extraction      No family history on file.  Social History:  reports that she has never smoked. She does not have any smokeless tobacco history on file. She reports that she does not drink alcohol or use illicit drugs.  ALLERGIES/MEDS:  Allergies: No Known Allergies  Prescriptions prior to admission  Medication Sig Dispense Refill Last Dose  . Multiple Vitamin (MULTIVITAMIN WITH MINERALS) TABS tablet Take 1 tablet by mouth daily.   08/08/2015 at Unknown time  . Omega-3 Fatty Acids (FISH OIL PO) Take 1 capsule by mouth every other day.   08/08/2015 at Unknown time     Review of Systems  Constitutional: Negative.   HENT: Negative.   Eyes: Negative.   Respiratory: Negative.   Cardiovascular: Negative.   Gastrointestinal: Negative.   Genitourinary:       Vaginal bleeding and occasional mild cramping  Musculoskeletal: Negative.   Skin: Negative.   Neurological: Negative.   Endo/Heme/Allergies: Negative.   Psychiatric/Behavioral: Negative.     Blood pressure 117/61, pulse 57, temperature 98.4 F (36.9 C), temperature source Oral, resp. rate 16, height 5\' 6"  (1.676 m), weight 87.998 kg (194 lb), SpO2 100 %, unknown if currently breastfeeding.   Physical  Exam  Constitutional: She is oriented to person, place, and time. She appears well-developed and well-nourished.  HENT:  Head: Normocephalic and atraumatic.  Eyes: Pupils are equal, round, and reactive to light.  Neck: Normal range of motion.  Cardiovascular: Normal rate.   Respiratory: Effort normal.  GI: Soft.  Genitourinary: There is tenderness and bleeding in the vagina. Vaginal discharge found.  Musculoskeletal: Normal range of motion.  Neurological: She is alert and oriented to person, place, and time. She has normal reflexes.  Skin: Skin is warm and dry.  Psychiatric: She has a normal mood and affect. Her behavior is normal. Judgment and thought content normal.    Results for orders placed or performed during the hospital encounter of 08/09/15 (from the past 24 hour(s))  Urinalysis, Routine w reflex microscopic ([facility])     Status: Abnormal   Collection Time: 08/09/15  9:02 PM  Result Value Ref Range   Color, Urine YELLOW YELLOW   APPearance HAZY (A) CLEAR   Specific Gravity, Urine >1.030 (H) 1.005 - 1.030   pH 5.5 5.0 - 8.0   Glucose, UA NEGATIVE NEGATIVE mg/dL   Hgb urine dipstick LARGE (A) NEGATIVE   Bilirubin Urine NEGATIVE NEGATIVE   Ketones, ur 15 (A) NEGATIVE mg/dL   Protein, ur NEGATIVE NEGATIVE mg/dL   Nitrite NEGATIVE NEGATIVE   Leukocytes, UA NEGATIVE NEGATIVE  Urine microscopic-add on     Status: Abnormal  Collection Time: 08/09/15  9:02 PM  Result Value Ref Range   Squamous Epithelial / LPF 6-30 (A) NONE SEEN   WBC, UA 0-5 0 - 5 WBC/hpf   RBC / HPF 6-30 0 - 5 RBC/hpf   Bacteria, UA MANY (A) NONE SEEN   Urine-Other MUCOUS PRESENT   Wet prep, genital     Status: Abnormal   Collection Time: 08/09/15 11:00 PM  Result Value Ref Range   Yeast Wet Prep HPF POC NONE SEEN NONE SEEN   Trich, Wet Prep NONE SEEN NONE SEEN   Clue Cells Wet Prep HPF POC PRESENT (A) NONE SEEN   WBC, Wet Prep HPF POC FEW (A) NONE SEEN   Sperm NONE SEEN   CBC     Status:  Abnormal   Collection Time: 08/09/15 11:10 PM  Result Value Ref Range   WBC 11.6 (H) 4.0 - 10.5 K/uL   RBC 3.62 (L) 3.87 - 5.11 MIL/uL   Hemoglobin 11.9 (L) 12.0 - 15.0 g/dL   HCT 66.0 (L) 63.0 - 16.0 %   MCV 94.5 78.0 - 100.0 fL   MCH 32.9 26.0 - 34.0 pg   MCHC 34.8 30.0 - 36.0 g/dL   RDW 10.9 32.3 - 55.7 %   Platelets 313 150 - 400 K/uL  hCG, quantitative, pregnancy     Status: Abnormal   Collection Time: 08/09/15 11:14 PM  Result Value Ref Range   hCG, Beta Chain, Quant, S 11975 (H) <5 mIU/mL  Type and screen [facility]     Status: None   Collection Time: 08/10/15 12:43 AM  Result Value Ref Range   ABO/RH(D) O POS    Antibody Screen NEG    Sample Expiration 08/13/2015     US Ob Comp Less 14 Wks  08/10/2015  CLINICAL DATA:  Vaginal bleeding and cramping. Estimated gestational age by LMP is 10 weeks 2 days. Quantitative beta HCG is in progress. EXAM: OBSTETRIC <14 WK [place] AND TRANSVAGINAL OB US TECHNIQUE: Both transabdominal and transvaginal ultrasound examinations were performed for complete evaluation of the gestation as well as the maternal uterus, adnexal regions, and pelvic cul-de-sac. Transvaginal technique was performed to assess early pregnancy. COMPARISON:  None. FINDINGS: Intrauterine gestational sac: Intrauterine gestational sac is present. Gestational sac is somewhat flat. Yolk sac:  Yolk sac is present. Embryo:  Fetal pole is present. Cardiac Activity: No fetal cardiac activity is identified. CRL:  14.8  mm   7 w   6 d                  [place] EDC: 03/21/2016 Subchorionic hemorrhage:  None visualized. Maternal uterus/adnexae: Uterus is anteverted. No myometrial mass lesions are demonstrated. Cervix is unremarkable. Both ovaries are visualized. No abnormal adnexal masses are seen. No free fluid in the pelvis. IMPRESSION: Single intrauterine pregnancy is identified. Estimated gestational age based on crown-rump length is 7 weeks 6 days. No fetal cardiac activity is  observed. Based on crown-rump length of greater than 7 mm and no heartbeat, this meets criteria for failed pregnancy. This follows SRU consensus guidelines: Diagnostic Criteria for Nonviable Pregnancy Early in the First Trimester. [facility] Mis J Med [phone]. Electronically Signed   By: Burman Nieves M.D.   On: 08/10/2015 00:06   US Ob Transvaginal  08/10/2015  CLINICAL DATA:  Vaginal bleeding and cramping. Estimated gestational age by LMP is 10 weeks 2 days. Quantitative beta HCG is in progress. EXAM: OBSTETRIC <14 WK [place] AND TRANSVAGINAL OB US  TECHNIQUE: Both transabdominal and transvaginal ultrasound examinations were performed for complete evaluation of the gestation as well as the maternal uterus, adnexal regions, and pelvic cul-de-sac. Transvaginal technique was performed to assess early pregnancy. COMPARISON:  None. FINDINGS: Intrauterine gestational sac: Intrauterine gestational sac is present. Gestational sac is somewhat flat. Yolk sac:  Yolk sac is present. Embryo:  Fetal pole is present. Cardiac Activity: No fetal cardiac activity is identified. CRL:  14.8  mm   7 w   6 d                  [place] EDC: 03/21/2016 Subchorionic hemorrhage:  None visualized. Maternal uterus/adnexae: Uterus is anteverted. No myometrial mass lesions are demonstrated. Cervix is unremarkable. Both ovaries are visualized. No abnormal adnexal masses are seen. No free fluid in the pelvis. IMPRESSION: Single intrauterine pregnancy is identified. Estimated gestational age based on crown-rump length is 7 weeks 6 days. No fetal cardiac activity is observed. Based on crown-rump length of greater than 7 mm and no heartbeat, this meets criteria for failed pregnancy. This follows SRU consensus guidelines: Diagnostic Criteria for Nonviable Pregnancy Early in the First Trimester. [facility] Mis J Med [phone]. Electronically Signed   By: Burman Nieves M.D.   On: 08/10/2015 00:06    ASSESSMENT: Failed Pregnancy IUP at [redacted]w[redacted]d Vaginal  bleeding Pelvic Cramping No Fetal cardiac activilty CRL consistent with [redacted]w[redacted]d  PLAN: Counseled regarding treatment options, desires D&C Per Consult with Dr. Charlotta Newton, admit to women's unit  NPO  Since midnight    Alphonzo Severance CNM 08/10/2015, 3:22 AM

## 2015-08-10 NOTE — Transfer of Care (Signed)
Immediate Anesthesia Transfer of Care Note  Patient: Crystal MeigsRolanda M Heinsohn  Procedure(s) Performed: Procedure(s): DILATATION AND EVACUATION  Patient Location: PACU  Anesthesia Type:MAC  Level of Consciousness: sedated  Airway & Oxygen Therapy: Patient Spontanous Breathing and Patient connected to nasal cannula oxygen  Post-op Assessment: Report given to RN and Post -op Vital signs reviewed and stable  Post vital signs: stable  Last Vitals:  Filed Vitals:   08/10/15 0102 08/10/15 0550  BP: 117/61 102/55  Pulse: 57 57  Temp: 36.9 C 36.8 C  Resp: 16 14    Complications: No apparent anesthesia complications

## 2015-08-10 NOTE — Anesthesia Preprocedure Evaluation (Signed)
Anesthesia Evaluation  Patient identified by MRN, date of birth, ID band Patient awake    Reviewed: Allergy & Precautions, NPO status , Patient's Chart, lab work & pertinent test results  Airway Mallampati: I  TM Distance: >3 FB Neck ROM: Full    Dental   Pulmonary neg pulmonary ROS,    breath sounds clear to auscultation       Cardiovascular negative cardio ROS   Rhythm:Regular Rate:Normal     Neuro/Psych negative neurological ROS     GI/Hepatic negative GI ROS, Neg liver ROS,   Endo/Other  negative endocrine ROS  Renal/GU negative Renal ROS     Musculoskeletal   Abdominal   Peds  Hematology negative hematology ROS (+)   Anesthesia Other Findings   Reproductive/Obstetrics IUFD                             Lab Results  Component Value Date   WBC 11.6* 08/09/2015   HGB 11.9* 08/09/2015   HCT 34.2* 08/09/2015   MCV 94.5 08/09/2015   PLT 313 08/09/2015   Lab Results  Component Value Date   CREATININE 0.79 03/31/2010   BUN 5* 03/31/2010   NA 136 03/31/2010   K 3.8 03/31/2010   CL 106 03/31/2010   CO2 26 03/31/2010   No results found for: INR, PROTIME  Anesthesia Physical Anesthesia Plan  ASA: II  Anesthesia Plan: MAC   Post-op Pain Management:    Induction: Intravenous  Airway Management Planned: Natural Airway and Simple Face Mask  Additional Equipment:   Intra-op Plan:   Post-operative Plan:   Informed Consent: I have reviewed the patients History and Physical, chart, labs and discussed the procedure including the risks, benefits and alternatives for the proposed anesthesia with the patient or authorized representative who has indicated his/her understanding and acceptance.     Plan Discussed with: CRNA  Anesthesia Plan Comments:         Anesthesia Quick Evaluation

## 2015-08-10 NOTE — Progress Notes (Signed)
The patient was interviewed and examined today.  The previously documented history and physical examination was reviewed. There are no changes. The operative procedure was reviewed. The risks and benefits were outlined again. The specific risks include, but are not limited to, anesthetic complications, bleeding, infections, and possible damage to the surrounding organs. The patient's questions were answered.  We are ready to proceed as outlined. The likelihood of the patient achieving the goals of this procedure is very likely.   Blood Type: O+  BP 102/55 mmHg  Pulse 57  Temp(Src) 98.3 F (36.8 C) (Oral)  Resp 14  Ht 5\' 6"  (1.676 m)  Wt 194 lb (87.998 kg)  BMI 31.33 kg/m2  SpO2 98%  CBC    Component Value Date/Time   WBC 11.6* 08/09/2015 2310   RBC 3.62* 08/09/2015 2310   HGB 11.9* 08/09/2015 2310   HCT 34.2* 08/09/2015 2310   PLT 313 08/09/2015 2310   MCV 94.5 08/09/2015 2310   MCH 32.9 08/09/2015 2310   MCHC 34.8 08/09/2015 2310   RDW 13.5 08/09/2015 2310   Leonard SchwartzArthur Vernon Wessie Shanks, M.D. 08/10/15

## 2015-08-10 NOTE — Discharge Summary (Signed)
  Physician Discharge Summary  Patient ID: Crystal Cobb MRN: [id number] DOB/AGE: [birth year] 45 y.o.  Admit date:         08/09/2015 Discharge date: 08/10/2015  Admission Diagnoses:  10 week IUP with fetal demise  Discharge Diagnoses:   10 week IUP with fetal demise  Anemia  Procedures this Admission:  08/09/2015  Procedure(s): DILATATION AND EVACUATION  Discharged Condition: good   Admission Hx and PE: The patient has [facility] [address] [facility] [facility]. She has a history of  10 week IUP with fetal demise. Please see her documented history and physical exam. The patient was seen in the emergency department were a nonviable gestation was documented by ultrasound. Her blood type is O+.  Hospital course:  The patient [facility]. A nonviable gestation was documented. On 08/10/2015 the patient underwent the following Procedure(s): DILATATION AND EVACUATION. Operative findings included an 8-10 week size uterus. Her postoperative course was uneventful.  Labs:  HEMOGLOBIN  Date Value Ref Range Status  08/09/2015 11.9* 12.0 - 15.0 g/dL Final   HCT  Date Value Ref Range Status  08/09/2015 34.2* 36.0 - 46.0 % Final    Consults: None  Final pathology report: Pending at the time of discharge  Disposition:  The patient will be discharged to home. She has been given a copy of the discharge instructions as prepared by the [facility] for patients who have undergone the Procedure(s): DILATATION AND EVACUATION.      Medication List    TAKE these medications        FISH OIL PO  Take 1 capsule by mouth every other day.     ibuprofen 800 MG tablet  Commonly known as:  ADVIL,MOTRIN  Take 1 tablet (800 mg total) by mouth every 8 (eight) hours as needed.     multivitamin with minerals Tabs tablet  Take 1 tablet by mouth daily.            Follow-up Information    Follow up with Crystal Cobb In 2 weeks.   Specialty:  Obstetrics and Gynecology   Contact information:   [address] [place] [phone]       Signed: Janine LimboSTRINGER,Marlaina Coburn Cobb 08/10/2015, 12:04 PM

## 2015-08-10 NOTE — Anesthesia Postprocedure Evaluation (Signed)
Anesthesia Post Note  Patient: Crystal MeigsRolanda M Aydt  Procedure(s) Performed: Procedure(s): DILATATION AND EVACUATION  Patient location during evaluation: PACU Anesthesia Type: MAC Level of consciousness: awake and alert Pain management: pain level controlled Vital Signs Assessment: post-procedure vital signs reviewed and stable Respiratory status: spontaneous breathing, nonlabored ventilation, respiratory function stable and patient connected to nasal cannula oxygen Cardiovascular status: stable and blood pressure returned to baseline Anesthetic complications: no    Last Vitals:  Filed Vitals:   08/10/15 1330 08/10/15 1430  BP: 102/61 96/58  Pulse: 51 56  Temp: 36.7 C 36.8 C  Resp: 16 16    Last Pain:  Filed Vitals:   08/10/15 1444  PainSc: Asleep                 Kennieth RadFitzgerald, Justus Droke E

## 2015-08-11 ENCOUNTER — Encounter (HOSPITAL_COMMUNITY): Payer: Self-pay | Admitting: Obstetrics and Gynecology

## 2015-08-11 LAB — GC/CHLAMYDIA PROBE AMP (~~LOC~~) NOT AT ARMC
Chlamydia: NEGATIVE
Neisseria Gonorrhea: NEGATIVE

## 2016-06-13 ENCOUNTER — Encounter (HOSPITAL_COMMUNITY): Payer: Self-pay

## 2019-06-19 ENCOUNTER — Encounter (HOSPITAL_COMMUNITY): Payer: Self-pay | Admitting: Emergency Medicine

## 2019-06-19 ENCOUNTER — Ambulatory Visit (INDEPENDENT_AMBULATORY_CARE_PROVIDER_SITE_OTHER): Payer: Medicaid Other

## 2019-06-19 ENCOUNTER — Other Ambulatory Visit: Payer: Self-pay

## 2019-06-19 ENCOUNTER — Ambulatory Visit (HOSPITAL_COMMUNITY)
Admission: EM | Admit: 2019-06-19 | Discharge: 2019-06-19 | Disposition: A | Discharge status: Home / Self Care | Payer: Medicaid Other | Attending: Family Medicine | Admitting: Family Medicine

## 2019-06-19 DIAGNOSIS — R519 Headache, unspecified: Secondary | ICD-10-CM

## 2019-06-19 DIAGNOSIS — R0789 Other chest pain: Secondary | ICD-10-CM | POA: Insufficient documentation

## 2019-06-19 DIAGNOSIS — U071 COVID-19: Secondary | ICD-10-CM | POA: Diagnosis not present

## 2019-06-19 DIAGNOSIS — M791 Myalgia, unspecified site: Secondary | ICD-10-CM

## 2019-06-19 DIAGNOSIS — Z20822 Contact with and (suspected) exposure to covid-19: Secondary | ICD-10-CM | POA: Diagnosis present

## 2019-06-19 DIAGNOSIS — R509 Fever, unspecified: Secondary | ICD-10-CM

## 2019-06-19 HISTORY — DX: Pure hypercholesterolemia, unspecified: E78.00

## 2019-06-19 NOTE — ED Provider Notes (Signed)
[facility]    CSN: [id number] Arrival date & time: 06/19/19  1217      History   Chief Complaint Chief Complaint  Patient presents with  . Headache    HPI Crystal Cobb is a 49 y.o. female.   HPI  Past Medical History:  Diagnosis Date  . High cholesterol     There are no problems to display for this patient. Patient is here for symptoms of headache, fever chills, body aches, chest tightness and heartburn.  They have been going on for 4 to 5 days now.  She states that she was exposed to her ex-husband.  He tested positive for Covid.  She started having symptoms about 4 5 days after she saw him. No cough or shortness of breath.  No underlying asthma or lung disease. Patient states that she has some high cholesterol.  No diabetes.  No other medical problems. Past Surgical History:  Procedure Laterality Date  . BREAST REDUCTION SURGERY    . DILATION AND EVACUATION  08/10/2015   Procedure: DILATATION AND EVACUATION;  Surgeon: Kirkland Hun, MD;  Location: [facility];  Service: Gynecology;;  . KNEE SURGERY    . lipo suction     as well as skin readjusted and tucked  . TUMMY     tummy tuck  . UNILATERAL SALPINGECTOMY     right side  . WISDOM TOOTH EXTRACTION      OB History    Gravida  6   Para  2   Term      Preterm      AB  1   Living  2     SAB      TAB      Ectopic  1   Multiple      Live Births               Home Medications    Prior to Admission medications   Medication Sig Start Date End Date Taking? Authorizing Provider  Multiple Vitamin (MULTIVITAMIN WITH MINERALS) TABS tablet Take 1 tablet by mouth daily.   Yes [provider]  Pseudoeph-Doxylamine-DM-APAP (NYQUIL PO) Take by mouth.   Yes [provider]  Pseudoephedrine-APAP-DM (DAYQUIL PO) Take by mouth.   Yes [provider]  ibuprofen (ADVIL,MOTRIN) 800 MG tablet Take 1 tablet (800 mg total) by mouth every 8 (eight) hours as needed. 08/10/15    Kirkland Hun, MD    Family History History reviewed. No pertinent family history.  Social History Social History   Tobacco Use  . Smoking status: Never Smoker  Substance Use Topics  . Alcohol use: No  . Drug use: No     Allergies   Patient has no known allergies.   Review of Systems Review of Systems  Constitutional: Positive for appetite change, chills and fever.  HENT: Negative for congestion and sore throat.   Respiratory: Positive for chest tightness. Negative for cough and shortness of breath.   Gastrointestinal: Positive for nausea. Negative for vomiting.  Musculoskeletal: Positive for myalgias.  Neurological: Positive for headaches.     Physical Exam Triage Vital Signs ED Triage Vitals  Enc Vitals Group     BP 06/19/19 1237 135/86     Pulse Rate 06/19/19 1237 83     Resp 06/19/19 1237 16     Temp 06/19/19 1237 99.2 F (37.3 C)     Temp Source 06/19/19 1237 Oral     SpO2 06/19/19 1237 99 %  Weight --      Height --      Head Circumference --      Peak Flow --      Pain Score 06/19/19 1231 7     Pain Loc --      Pain Edu? --      Excl. in GC? --    No data found.  Updated Vital Signs BP 135/86 (BP Location: Right Arm)   Pulse 83   Temp 99.2 F (37.3 C) (Oral)   Resp 16   LMP 05/31/2019   SpO2 99%       Physical Exam Constitutional:      General: She is not in acute distress.    Appearance: She is well-developed. She is ill-appearing.     Comments: Appears tired.  Appears moderately ill  HENT:     Head: Normocephalic and atraumatic.     Mouth/Throat:     Comments: Mask in place Eyes:     Conjunctiva/sclera: Conjunctivae normal.     Pupils: Pupils are equal, round, and reactive to light.  Cardiovascular:     Rate and Rhythm: Normal rate and regular rhythm.     Heart sounds: Normal heart sounds.  Pulmonary:     Effort: Pulmonary effort is normal. No respiratory distress.     Breath sounds: Normal breath sounds. No wheezing  or rales.  Abdominal:     General: There is no distension.  Musculoskeletal:        General: Normal range of motion.     Cervical back: Normal range of motion.  Lymphadenopathy:     Cervical: No cervical adenopathy.  Skin:    General: Skin is warm and dry.  Neurological:     Mental Status: She is alert.     Gait: Gait normal.  Psychiatric:        Mood and Affect: Mood normal.        Behavior: Behavior normal.      UC Treatments / Results  Labs (all labs ordered are listed, but only abnormal results are displayed) Labs Reviewed  NOVEL CORONAVIRUS, NAA (HOSP ORDER, SEND-OUT TO REF LAB; TAT 18-24 HRS)    EKG   Radiology DG Chest 2 View  Result Date: 06/19/2019 CLINICAL DATA:  Chest pain for 2 days EXAM: CHEST - 2 VIEW COMPARISON:  None. FINDINGS: The heart size and mediastinal contours are within normal limits. Both lungs are clear. The visualized skeletal structures are unremarkable. IMPRESSION: No active cardiopulmonary disease. Electronically Signed   By: Alcide Clever M.D.   On: 06/19/2019 13:01    Procedures Procedures (including critical care time)  Medications Ordered in UC Medications - No data to display  Initial Impression / Assessment and Plan / UC Course  I have reviewed the triage vital signs and the nursing notes.  Pertinent labs & imaging results that were available during my care of the patient were reviewed by me and considered in my medical decision making (see chart for details).     Reviewed signs and symptoms of coronavirus.  Reviewed how to obtain test results.  Reviewed importance of home care. Final Clinical Impressions(s) / UC Diagnoses   Final diagnoses:  Close exposure to COVID-19 virus  Bad headache  Tightness in chest     Discharge Instructions     Go home to rest Drink plenty of fluids Take Tylenol for pain or fever You may take over-the-counter cough and cold medicines as needed You must quarantine at home until  your test  result is available You can check for your test result in [facility]   ED Prescriptions    None     PDMP not reviewed this encounter.   Raylene Everts, MD 06/19/19 [phone]

## 2019-06-19 NOTE — Discharge Instructions (Signed)
Go home to rest Drink plenty of fluids Take Tylenol for pain or fever You may take over-the-counter cough and cold medicines as needed You must quarantine at home until your test result is available You can check for your test result in MyChart CALL FOR QUESTIONS OR PROBLEMS

## 2019-06-19 NOTE — ED Triage Notes (Signed)
06/14/2019 started having body aches , headache.  Then started having chills, hot and cold episode, chest heavy and heart burn.

## 2019-06-19 NOTE — ED Notes (Signed)
Patient questioned about burning in chest.  What to take for this.  Asked dr Delton See about this .  Dr Delton See suggested Prilosec, or maalox, or tums.  Notified patient and agreed to plan.  Patient reports drinking a lot of orange juice.  Suggested patient limit orange juice intake.

## 2019-06-19 NOTE — ED Notes (Signed)
Nasal swab in lab 

## 2019-06-21 LAB — NOVEL CORONAVIRUS, NAA (HOSP ORDER, SEND-OUT TO REF LAB; TAT 18-24 HRS): SARS-CoV-2, NAA: DETECTED — AB

## 2019-06-23 ENCOUNTER — Telehealth (HOSPITAL_COMMUNITY): Payer: Self-pay | Admitting: Emergency Medicine

## 2019-06-23 ENCOUNTER — Encounter (HOSPITAL_COMMUNITY): Payer: Self-pay

## 2019-06-23 NOTE — Telephone Encounter (Signed)
Patient contacted by phone and made aware of  covid positive  results. Pt verbalized understanding and had all questions answered.

## 2019-06-23 NOTE — Telephone Encounter (Signed)

## 2019-12-30 ENCOUNTER — Telehealth: Payer: Self-pay

## 2019-12-30 NOTE — Telephone Encounter (Signed)
Attempted to contact patient regarding mammography scholarship application. Left message on voicemail requesting patient to return call to office.

## 2020-07-23 ENCOUNTER — Other Ambulatory Visit: Payer: Self-pay | Admitting: Nurse Practitioner

## 2020-07-23 DIAGNOSIS — Z1231 Encounter for screening mammogram for malignant neoplasm of breast: Secondary | ICD-10-CM

## 2020-08-19 ENCOUNTER — Ambulatory Visit
Admission: RE | Admit: 2020-08-19 | Discharge: 2020-08-19 | Disposition: A | Discharge status: Home / Self Care | Payer: No Typology Code available for payment source | Source: Ambulatory Visit | Attending: Nurse Practitioner | Admitting: Nurse Practitioner

## 2020-08-19 ENCOUNTER — Other Ambulatory Visit: Payer: Self-pay

## 2020-08-19 DIAGNOSIS — Z1231 Encounter for screening mammogram for malignant neoplasm of breast: Secondary | ICD-10-CM

## 2020-08-23 ENCOUNTER — Other Ambulatory Visit: Payer: Self-pay | Admitting: Obstetrics and Gynecology

## 2020-08-23 ENCOUNTER — Other Ambulatory Visit: Payer: Self-pay | Admitting: Nurse Practitioner

## 2020-08-23 DIAGNOSIS — R928 Other abnormal and inconclusive findings on diagnostic imaging of breast: Secondary | ICD-10-CM

## 2020-09-01 ENCOUNTER — Other Ambulatory Visit: Payer: Self-pay

## 2020-09-01 DIAGNOSIS — R928 Other abnormal and inconclusive findings on diagnostic imaging of breast: Secondary | ICD-10-CM

## 2020-09-01 NOTE — Progress Notes (Signed)
ab 

## 2020-09-28 ENCOUNTER — Other Ambulatory Visit: Payer: No Typology Code available for payment source

## 2020-09-28 ENCOUNTER — Ambulatory Visit: Payer: No Typology Code available for payment source

## 2020-10-05 ENCOUNTER — Ambulatory Visit
Admission: RE | Admit: 2020-10-05 | Discharge: 2020-10-05 | Disposition: A | Discharge status: Home / Self Care | Payer: No Typology Code available for payment source | Source: Ambulatory Visit | Attending: Obstetrics and Gynecology | Admitting: Obstetrics and Gynecology

## 2020-10-05 ENCOUNTER — Other Ambulatory Visit: Payer: Self-pay | Admitting: Obstetrics and Gynecology

## 2020-10-05 ENCOUNTER — Other Ambulatory Visit: Payer: Self-pay

## 2020-10-05 ENCOUNTER — Ambulatory Visit: Admission: RE | Admit: 2020-10-05 | Payer: No Typology Code available for payment source | Source: Ambulatory Visit

## 2020-10-05 ENCOUNTER — Ambulatory Visit: Payer: Self-pay | Admitting: *Deleted

## 2020-10-05 VITALS — BP 132/88 | Wt 218.6 lb

## 2020-10-05 DIAGNOSIS — R928 Other abnormal and inconclusive findings on diagnostic imaging of breast: Secondary | ICD-10-CM

## 2020-10-05 DIAGNOSIS — Z1239 Encounter for other screening for malignant neoplasm of breast: Secondary | ICD-10-CM

## 2020-10-05 NOTE — Progress Notes (Signed)
Ms. Crystal Cobb is a 50 y.o. female who presents to [facility] clinic today with no complaints. Patient referred to [facility] by the [facility] due to recommending additional imaging of the right breast. Screening mammogram was completed 08/19/2020.    Pap Smear: Pap smear not completed today. Last Pap smear was 2 years ago at [facility] and was normal per patient. Per patient has no history of an abnormal Pap smear. Last Pap smear result is not available in Epic.   Physical exam: Breasts Breasts symmetrical. Scars bilateral lower breast due to history of breast reduction surgery 20 years ago. No nipple retraction bilateral breasts. No nipple discharge bilateral breasts. No lymphadenopathy. No lumps palpated bilateral breasts. No complaints of pain or tenderness on exam.      MS DIGITAL SCREENING TOMO BILATERAL  Result Date: 08/20/2020 CLINICAL DATA:  Screening. EXAM: DIGITAL SCREENING BILATERAL MAMMOGRAM WITH TOMOSYNTHESIS AND CAD TECHNIQUE: Bilateral screening digital craniocaudal and mediolateral oblique mammograms were obtained. Bilateral screening digital breast tomosynthesis was performed. The images were evaluated with computer-aided detection. COMPARISON:  None. ACR Breast Density Category c: The breast tissue is heterogeneously dense, which may obscure small masses. FINDINGS: In the right breast, a possible mass warrants further evaluation. In the left breast, no findings suspicious for malignancy. IMPRESSION: Further evaluation is suggested for a possible mass in the right breast. RECOMMENDATION: Diagnostic mammogram and possibly ultrasound of the right breast. (Code:[id number]) The patient will be contacted regarding the findings, and additional imaging will be scheduled. BI-RADS CATEGORY  0: Incomplete. Need additional imaging evaluation and/or prior mammograms for comparison. Electronically Signed   By: Gerome Sam III M.D   On: 08/20/2020 16:36    Pelvic/Bimanual Pap is not indicated today per BCCCP guidelines.   Smoking History: Patient has never smoked.   Patient Navigation: Patient education provided. Access to services provided for patient through BCCCP program.  Colorectal Cancer Screening: Per patient has never had colonoscopy completed. Per patient completed a FIT Test 4 months ago. No complaints today.    Breast and Cervical Cancer Risk Assessment: Patient has family history of a maternal first cousin having breast cancer. Patient has no known genetic mutations or history of radiation treatment to the chest before age 52. Patient does not have history of cervical dysplasia, immunocompromised, or DES exposure in-utero.  Risk Assessment    Risk Scores      10/05/2020   Last edited by: Meryl Dare, CMA   5-year risk: 1 %   Lifetime risk: 8.9 %          A: BCCCP exam without pap smear No complaints.  P: Referred patient to the [facility] of [facility] for a right breast diagnostic mammogram per recommendation. Appointment scheduled Tuesday, Oct 05, 2020 at 1450.  Priscille Heidelberg, RN 10/05/2020 1:16 PM

## 2020-10-05 NOTE — Patient Instructions (Signed)
Explained breast self awareness with Darrick Meigs. Patient did not need a Pap smear today due to last Pap smear was 2 years ago per patient. Let her know BCCCP will cover Pap smears every 3 years unless has a history of abnormal Pap smears. Referred patient to the [facility] of [facility] for a right breast diagnostic mammogram per recommendation. Appointment scheduled Tuesday, Oct 05, 2020 at 1450. Patient aware of appointment and will be there. Darrick Meigs verbalized understanding.  Vaudine Dutan, Kathaleen Maser, RN 1:16 PM

## 2021-09-05 ENCOUNTER — Telehealth: Payer: Self-pay

## 2021-09-05 NOTE — Telephone Encounter (Signed)
Telephoned patient at mobile number. Left a message with BCCCP contact information. 

## 2021-09-06 ENCOUNTER — Other Ambulatory Visit: Payer: Self-pay | Admitting: Obstetrics and Gynecology

## 2021-09-06 DIAGNOSIS — Z1231 Encounter for screening mammogram for malignant neoplasm of breast: Secondary | ICD-10-CM

## 2021-11-10 ENCOUNTER — Ambulatory Visit: Payer: Self-pay | Admitting: *Deleted

## 2021-11-10 ENCOUNTER — Ambulatory Visit
Admission: RE | Admit: 2021-11-10 | Discharge: 2021-11-10 | Disposition: A | Discharge status: Home / Self Care | Payer: Self-pay | Source: Ambulatory Visit | Attending: Obstetrics and Gynecology | Admitting: Obstetrics and Gynecology

## 2021-11-10 VITALS — BP 150/88 | Wt 214.9 lb

## 2021-11-10 DIAGNOSIS — Z1231 Encounter for screening mammogram for malignant neoplasm of breast: Secondary | ICD-10-CM

## 2021-11-10 DIAGNOSIS — Z1239 Encounter for other screening for malignant neoplasm of breast: Secondary | ICD-10-CM

## 2021-11-10 NOTE — Patient Instructions (Signed)
Explained breast self awareness with [name]. Patient did not need a Pap smear today due to last Pap smear and HPV typing was 04/09/2019. Let her know BCCCP will cover Pap smears and HPV typing every 5 years unless has a history of abnormal Pap smears. Referred patient to the [facility] of [facility] for a screening mammogram on mobile unit. Appointment scheduled Thursday, November 10, 2021 at 1500. Patient aware of appointment and will be there. Let patient know the Breast Center will follow up with her within the next couple weeks with results of mammogram by letter or phone. Crystal Cobb verbalized understanding.  Rowena Moilanen, Kathaleen Maser, RN 2:29 PM

## 2021-11-10 NOTE — Progress Notes (Signed)
Crystal Cobb is a 51 y.o. female who presents to [facility] clinic today with no complaints.    Pap Smear: Pap smear not completed today. Last Pap smear was 04/09/2019 at [facility] and was normal with negative HPV. Per patient has no history of an abnormal Pap smear. Last Pap smear result is available in Epic under [facility].   Physical exam: Breasts Breasts symmetrical. Scars bilateral lower breast due to history of breast reduction surgery 21 years ago. No nipple retraction bilateral breasts. No nipple discharge bilateral breasts. No lymphadenopathy. No lumps palpated bilateral breasts. No complaints of pain or tenderness on exam.      MS DIGITAL DIAG TOMO UNI RIGHT  Result Date: 10/05/2020 CLINICAL DATA:  51 year old female for further evaluation of possible RIGHT breast mass on baseline screening mammogram. EXAM: DIGITAL DIAGNOSTIC UNILATERAL RIGHT MAMMOGRAM WITH TOMOSYNTHESIS AND CAD TECHNIQUE: Right digital diagnostic mammography and breast tomosynthesis was performed. The images were evaluated with computer-aided detection. COMPARISON:  Previous exam(s). ACR Breast Density Category c: The breast tissue is heterogeneously dense, which may obscure small masses. FINDINGS: 2D/3D spot compression views of the RIGHT breast demonstrate no persistent suspicious abnormality at the site of the screening study finding. IMPRESSION: No persistent suspicious abnormality at the site of the screening study finding. RECOMMENDATION: Bilateral screening mammogram in 1 year. I have discussed the findings and recommendations with the patient. If applicable, a reminder letter will be sent to the patient regarding the next appointment. BI-RADS CATEGORY  1: Negative. Electronically Signed   By: Harmon Pier M.D.   On: 10/05/2020 15:03   MS DIGITAL SCREENING TOMO BILATERAL  Result Date: 08/20/2020 CLINICAL DATA:  Screening. EXAM: DIGITAL SCREENING BILATERAL MAMMOGRAM WITH TOMOSYNTHESIS  AND CAD TECHNIQUE: Bilateral screening digital craniocaudal and mediolateral oblique mammograms were obtained. Bilateral screening digital breast tomosynthesis was performed. The images were evaluated with computer-aided detection. COMPARISON:  None. ACR Breast Density Category c: The breast tissue is heterogeneously dense, which may obscure small masses. FINDINGS: In the right breast, a possible mass warrants further evaluation. In the left breast, no findings suspicious for malignancy. IMPRESSION: Further evaluation is suggested for a possible mass in the right breast. RECOMMENDATION: Diagnostic mammogram and possibly ultrasound of the right breast. (Code:[id number]) The patient will be contacted regarding the findings, and additional imaging will be scheduled. BI-RADS CATEGORY  0: Incomplete. Need additional imaging evaluation and/or prior mammograms for comparison. Electronically Signed   By: Gerome Sam III M.D   On: 08/20/2020 16:36     Pelvic/Bimanual Pap is not indicated today per BCCCP guidelines.   Smoking History: Patient has never smoked.   Patient Navigation: Patient education provided. Access to services provided for patient through BCCCP program.   Colorectal Cancer Screening: Per patient has never had colonoscopy completed. Patient completed a FIT Test 06/03/2021 that was negative. No complaints today.    Breast and Cervical Cancer Risk Assessment: Patient has family history of a maternal first cousin and her maternal great grandmother having breast cancer. Patient has no known genetic mutations or history of radiation treatment to the chest before age 40. Patient does not have history of cervical dysplasia, immunocompromised, or DES exposure in-utero.  Risk Assessment     Risk Scores       11/10/2021 10/05/2020   Last edited by: Meryl Dare, CMA Meryl Dare, CMA   5-year risk: 1.1 % 1 %   Lifetime risk: 8.8 % 8.9 %  A: BCCCP exam without pap  smear No complaints.  P: Referred patient to the [facility] of [facility] for a screening mammogram on mobile unit. Appointment scheduled Thursday, November 10, 2021 at 1500.  Priscille Heidelberg, RN 11/10/2021 2:28 PM

## 2022-07-16 IMAGING — MG MM DIGITAL DIAGNOSTIC UNILAT*R* W/ TOMO W/ CAD
4 series · 4 of 12 positions shown · non-contrast
Comparison: Previous exam(s).

CLINICAL DATA: 50-year-old female for further evaluation of
possible RIGHT breast mass on baseline screening mammogram.

EXAM:
DIGITAL DIAGNOSTIC UNILATERAL RIGHT MAMMOGRAM WITH TOMOSYNTHESIS AND
CAD
TECHNIQUE: Right digital diagnostic mammography and breast tomosynthesis was
performed. The images were evaluated with computer-aided detection.

[R CC synth-2D]
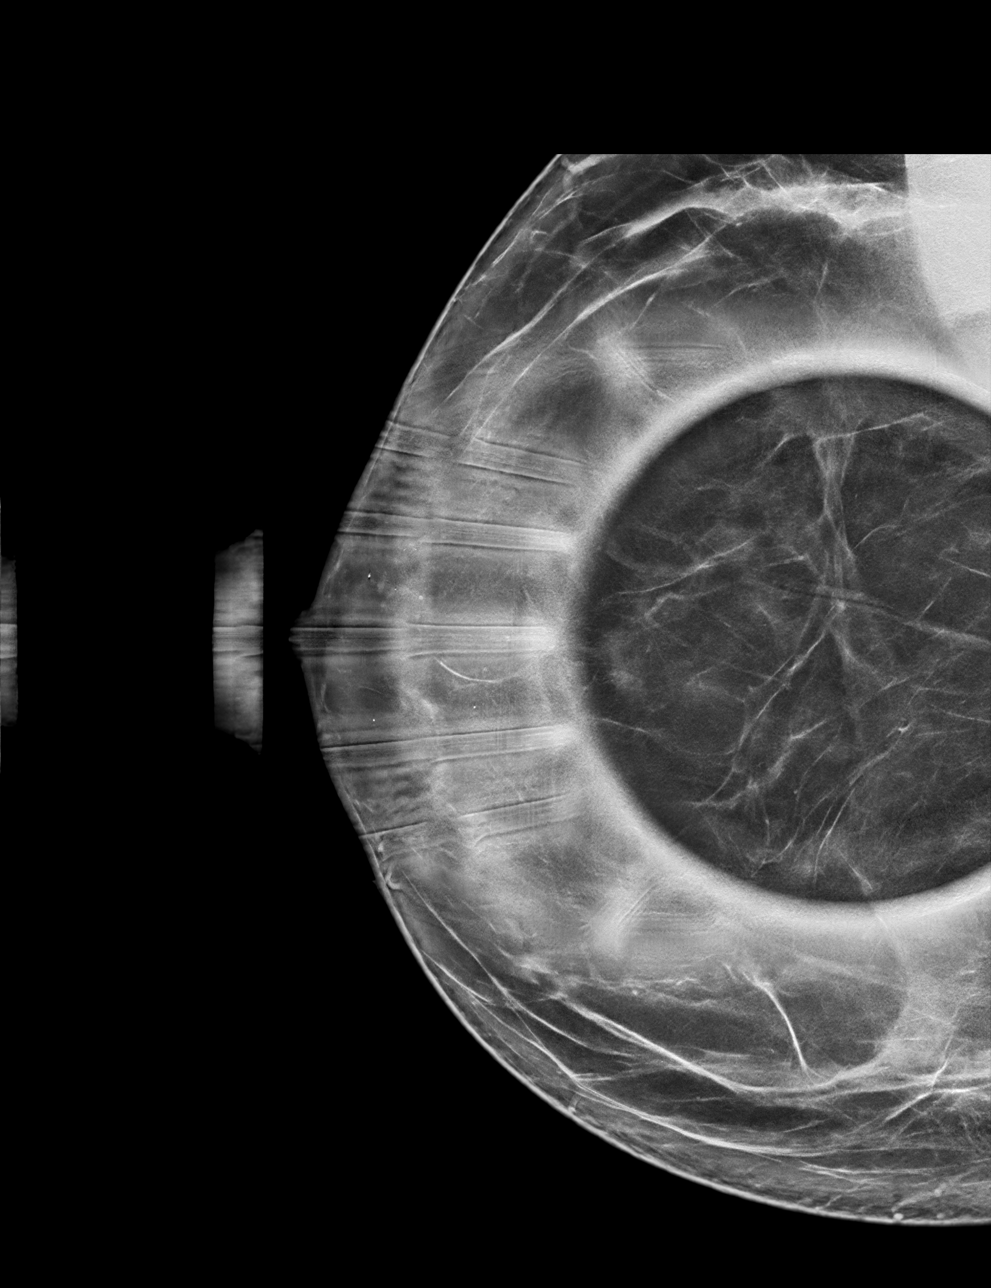

[R MLO synth-2D]
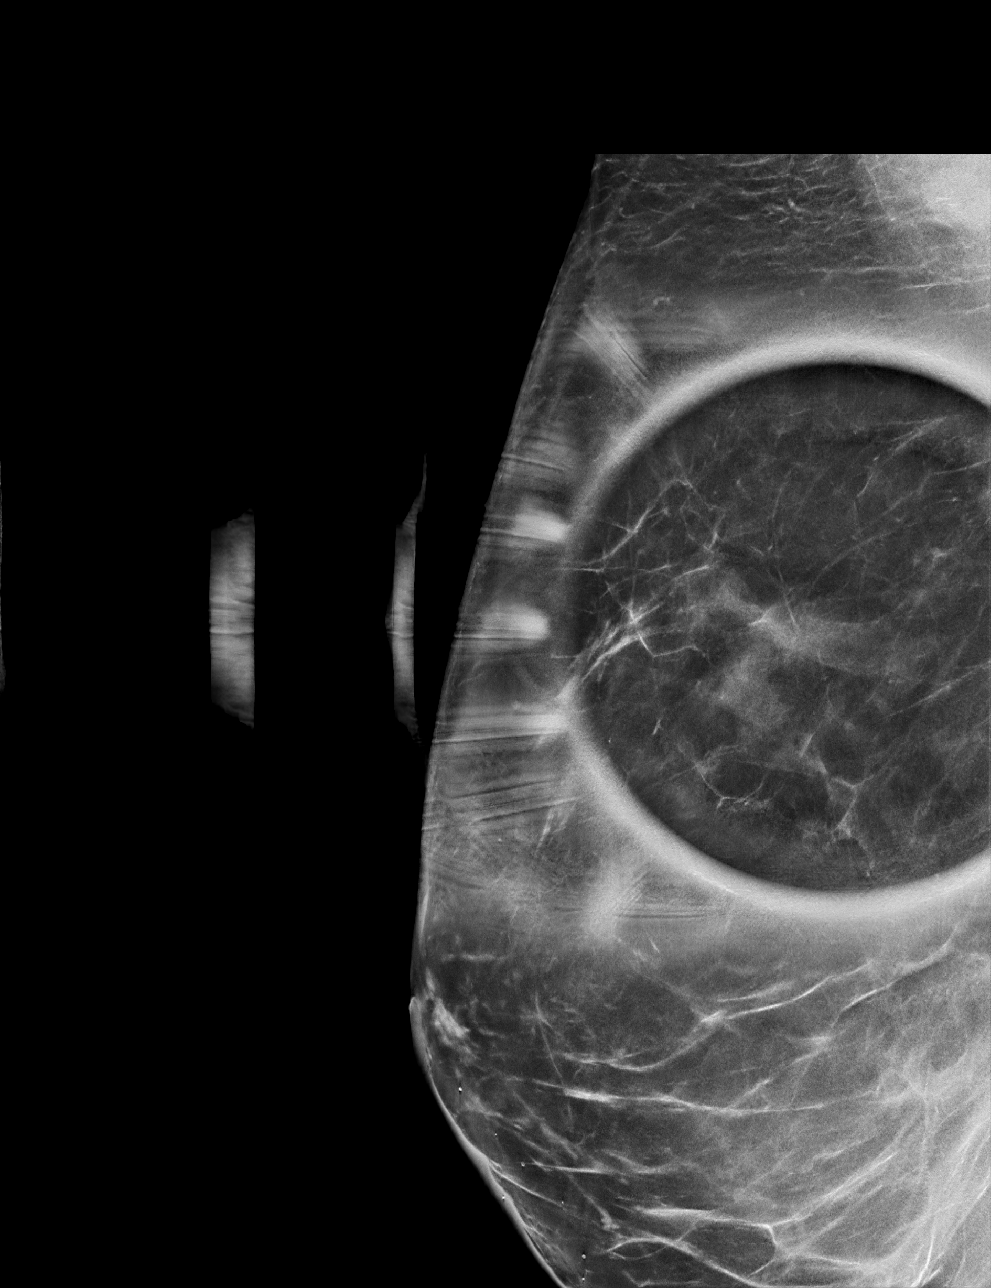

[R CC tomo · tomo slice 43/84.0]
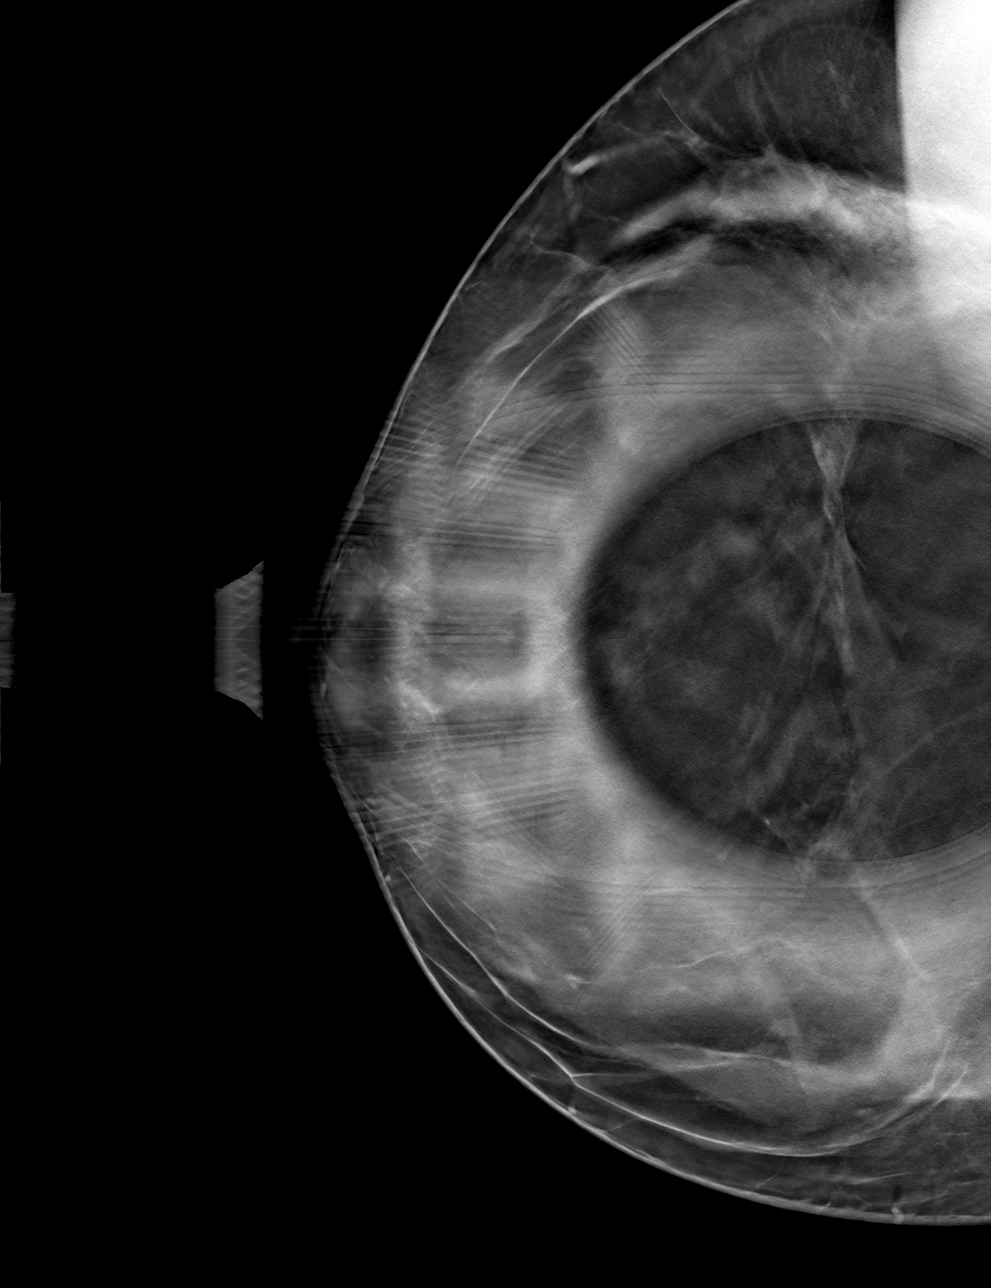

[R MLO tomo · tomo slice 43/85.0]
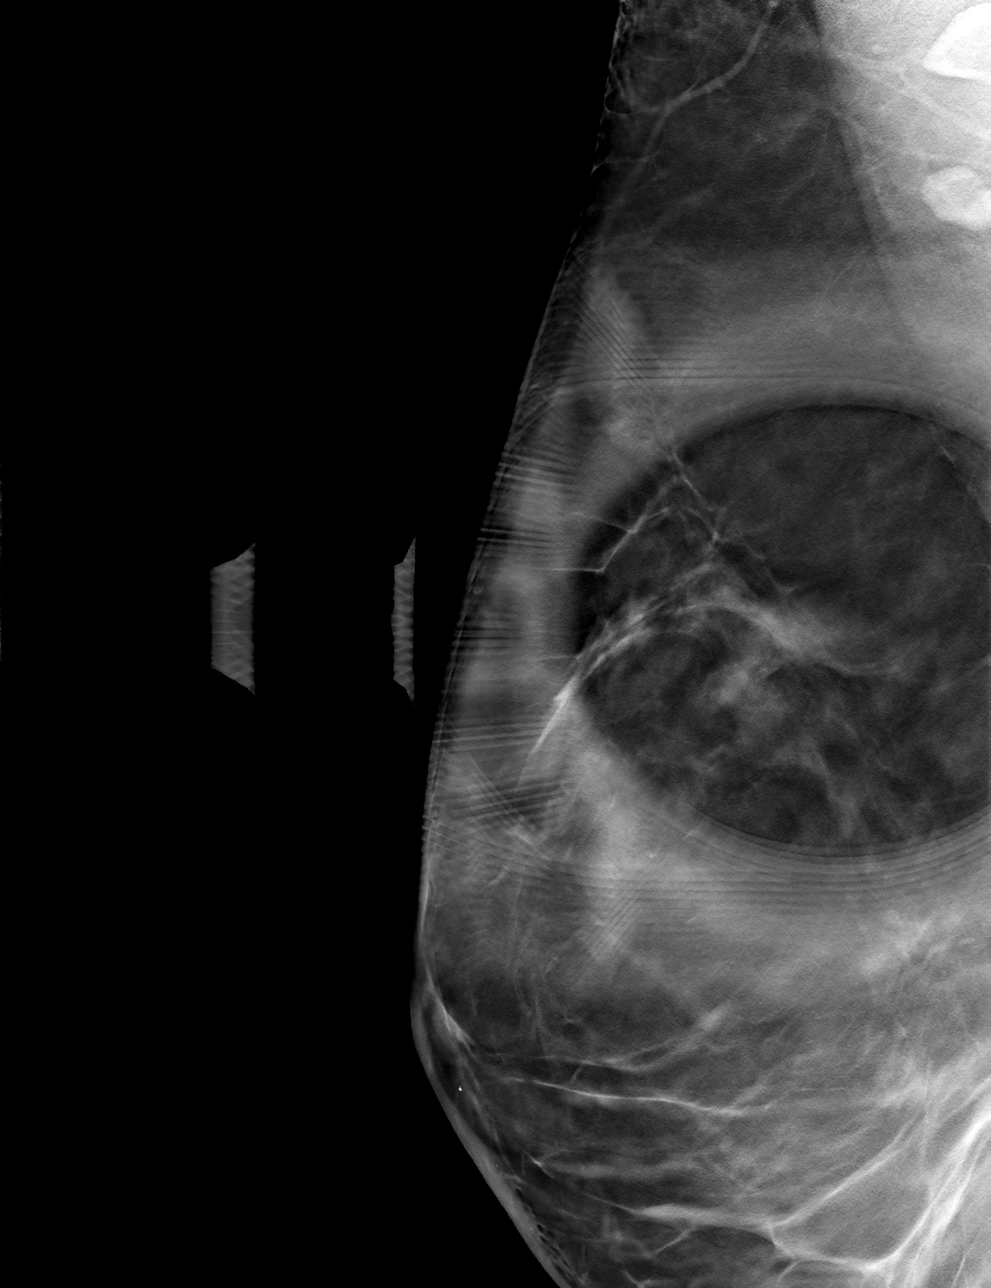

[4 of 12 positions shown; findings below may reference images not displayed]

ACR Breast Density Category c: The breast tissue is heterogeneously
dense, which may obscure small masses.
FINDINGS: 2D/3D spot compression views of the RIGHT breast demonstrate no
persistent suspicious abnormality at the site of the screening study
finding.
IMPRESSION: No persistent suspicious abnormality at the site of the screening
study finding.

RECOMMENDATION:
Bilateral screening mammogram in 1 year.

I have discussed the findings and recommendations with the patient.
If applicable, a reminder letter will be sent to the patient
regarding the next appointment.

BI-RADS CATEGORY  1: Negative.
